# Patient Record
Sex: Female | Born: 1960 | Race: White | Hispanic: No | Marital: Married | Smoking: Former smoker
Health system: Southern US, Community
[De-identification: ages and names within clinical notes are randomized; demographics above are authoritative.]

## PROBLEM LIST (undated history)

## (undated) DIAGNOSIS — K589 Irritable bowel syndrome without diarrhea: Secondary | ICD-10-CM

## (undated) DIAGNOSIS — N920 Excessive and frequent menstruation with regular cycle: Secondary | ICD-10-CM

## (undated) DIAGNOSIS — G43909 Migraine, unspecified, not intractable, without status migrainosus: Secondary | ICD-10-CM

## (undated) DIAGNOSIS — K219 Gastro-esophageal reflux disease without esophagitis: Secondary | ICD-10-CM

## (undated) DIAGNOSIS — N946 Dysmenorrhea, unspecified: Secondary | ICD-10-CM

## (undated) DIAGNOSIS — N950 Postmenopausal bleeding: Secondary | ICD-10-CM

---

## 2006-02-23 ENCOUNTER — Ambulatory Visit: Payer: Self-pay | Admitting: Nurse Practitioner

## 2006-10-13 ENCOUNTER — Ambulatory Visit: Payer: Self-pay

## 2006-10-15 ENCOUNTER — Ambulatory Visit: Payer: Self-pay

## 2006-11-16 ENCOUNTER — Ambulatory Visit: Payer: Self-pay | Admitting: General Surgery

## 2006-12-27 ENCOUNTER — Emergency Department: Payer: Self-pay | Admitting: Emergency Medicine

## 2006-12-27 ENCOUNTER — Other Ambulatory Visit: Payer: Self-pay

## 2007-07-08 ENCOUNTER — Ambulatory Visit: Payer: Self-pay | Admitting: Family Medicine

## 2007-07-13 ENCOUNTER — Ambulatory Visit: Payer: Self-pay | Admitting: Family Medicine

## 2007-10-12 ENCOUNTER — Ambulatory Visit: Payer: Self-pay

## 2008-03-23 ENCOUNTER — Emergency Department: Payer: Self-pay | Admitting: Emergency Medicine

## 2008-04-03 ENCOUNTER — Ambulatory Visit: Payer: Self-pay | Admitting: Family Medicine

## 2008-06-12 ENCOUNTER — Ambulatory Visit: Payer: Self-pay

## 2008-10-16 ENCOUNTER — Ambulatory Visit: Payer: Self-pay

## 2009-01-10 ENCOUNTER — Ambulatory Visit: Payer: Self-pay | Admitting: Internal Medicine

## 2009-02-06 ENCOUNTER — Ambulatory Visit: Payer: Self-pay | Admitting: Family Medicine

## 2009-02-07 ENCOUNTER — Ambulatory Visit: Payer: Self-pay | Admitting: Family Medicine

## 2009-09-03 ENCOUNTER — Ambulatory Visit: Payer: Self-pay | Admitting: Internal Medicine

## 2010-01-10 ENCOUNTER — Ambulatory Visit: Payer: Self-pay | Admitting: Family Medicine

## 2010-02-17 ENCOUNTER — Emergency Department: Payer: Self-pay | Admitting: Emergency Medicine

## 2010-02-19 ENCOUNTER — Ambulatory Visit: Payer: Self-pay | Admitting: Family Medicine

## 2010-02-20 ENCOUNTER — Ambulatory Visit: Payer: Self-pay | Admitting: Family Medicine

## 2010-04-14 ENCOUNTER — Ambulatory Visit: Payer: Self-pay | Admitting: Family Medicine

## 2011-01-28 ENCOUNTER — Ambulatory Visit: Payer: Self-pay

## 2012-04-13 ENCOUNTER — Ambulatory Visit: Payer: Self-pay

## 2012-07-05 ENCOUNTER — Ambulatory Visit: Payer: Self-pay | Admitting: Internal Medicine

## 2012-11-30 HISTORY — PX: BREAST EXCISIONAL BIOPSY: SUR124

## 2013-05-13 ENCOUNTER — Ambulatory Visit: Payer: Self-pay | Admitting: Family Medicine

## 2013-05-14 ENCOUNTER — Ambulatory Visit: Payer: Self-pay | Admitting: Internal Medicine

## 2013-05-24 ENCOUNTER — Ambulatory Visit: Payer: Self-pay

## 2013-05-31 ENCOUNTER — Ambulatory Visit: Payer: Self-pay

## 2013-06-09 ENCOUNTER — Ambulatory Visit: Payer: Self-pay | Admitting: Family Medicine

## 2013-06-09 LAB — COMPREHENSIVE METABOLIC PANEL
Albumin: 3.5 g/dL (ref 3.4–5.0)
Alkaline Phosphatase: 76 U/L (ref 50–136)
BUN: 8 mg/dL (ref 7–18)
Bilirubin,Total: 0.9 mg/dL (ref 0.2–1.0)
Chloride: 104 mmol/L (ref 98–107)
Creatinine: 0.82 mg/dL (ref 0.60–1.30)
EGFR (African American): >60
EGFR (Non-African Amer.): >60
Glucose: 92 mg/dL (ref 65–99)
Osmolality: 281 (ref 275–301)
Potassium: 3 mmol/L — ABNORMAL LOW (ref 3.5–5.1)
SGOT(AST): 14 U/L — ABNORMAL LOW (ref 15–37)

## 2013-06-09 LAB — CBC WITH DIFFERENTIAL/PLATELET
Basophil #: 0 10*3/uL (ref 0.0–0.1)
Basophil %: 0.4 %
Eosinophil %: 0.8 %
HCT: 34.3 % — ABNORMAL LOW (ref 35.0–47.0)
HGB: 11.2 g/dL — ABNORMAL LOW (ref 12.0–16.0)
Lymphocyte #: 1.3 10*3/uL (ref 1.0–3.6)
Lymphocyte %: 18 %
MCH: 30 pg (ref 26.0–34.0)
MCHC: 32.7 g/dL (ref 32.0–36.0)
MCV: 92 fL (ref 80–100)
Monocyte #: 0.5 x10 3/mm (ref 0.2–0.9)
Monocyte %: 7 %
Neutrophil #: 5.4 10*3/uL (ref 1.4–6.5)
Neutrophil %: 73.8 %
Platelet: 268 10*3/uL (ref 150–440)

## 2013-06-09 LAB — LIPASE, BLOOD: Lipase: 128 U/L (ref 73–393)

## 2013-06-09 LAB — URINALYSIS, COMPLETE
Bacteria: NEGATIVE
Bilirubin,UR: NEGATIVE
Ketone: NEGATIVE
Nitrite: NEGATIVE
Ph: 7 (ref 4.5–8.0)
Specific Gravity: 1.005 (ref 1.003–1.030)

## 2013-06-21 ENCOUNTER — Ambulatory Visit: Payer: Self-pay | Admitting: Gastroenterology

## 2013-06-28 DIAGNOSIS — R9389 Abnormal findings on diagnostic imaging of other specified body structures: Secondary | ICD-10-CM | POA: Insufficient documentation

## 2014-01-31 ENCOUNTER — Ambulatory Visit: Payer: Self-pay | Admitting: Family Medicine

## 2014-01-31 LAB — CBC WITH DIFFERENTIAL/PLATELET
Basophil #: 0 10*3/uL (ref 0.0–0.1)
Basophil %: 0.8 %
Eosinophil #: 0.1 10*3/uL (ref 0.0–0.7)
Eosinophil %: 1.4 %
HCT: 32 % — ABNORMAL LOW (ref 35.0–47.0)
HGB: 10.2 g/dL — AB (ref 12.0–16.0)
LYMPHS ABS: 1.8 10*3/uL (ref 1.0–3.6)
Lymphocyte %: 28.7 %
MCH: 29.7 pg (ref 26.0–34.0)
MCHC: 31.8 g/dL — ABNORMAL LOW (ref 32.0–36.0)
MCV: 93 fL (ref 80–100)
Monocyte #: 0.5 x10 3/mm (ref 0.2–0.9)
Monocyte %: 7.6 %
Neutrophil #: 4 10*3/uL (ref 1.4–6.5)
Neutrophil %: 61.5 %
Platelet: 324 10*3/uL (ref 150–440)
RBC: 3.43 10*6/uL — AB (ref 3.80–5.20)
RDW: 18 % — AB (ref 11.5–14.5)
WBC: 6.4 10*3/uL (ref 3.6–11.0)

## 2014-03-07 ENCOUNTER — Ambulatory Visit: Payer: Self-pay | Admitting: Family Medicine

## 2014-06-19 ENCOUNTER — Ambulatory Visit: Payer: Self-pay | Admitting: Family Medicine

## 2014-06-19 LAB — CBC WITH DIFFERENTIAL/PLATELET
BASOS ABS: 0 10*3/uL (ref 0.0–0.1)
Basophil %: 0.7 %
Eosinophil #: 0.1 10*3/uL (ref 0.0–0.7)
Eosinophil %: 1.6 %
HCT: 30 % — AB (ref 35.0–47.0)
HGB: 9.7 g/dL — AB (ref 12.0–16.0)
LYMPHS PCT: 28.2 %
Lymphocyte #: 1.7 10*3/uL (ref 1.0–3.6)
MCH: 29.4 pg (ref 26.0–34.0)
MCHC: 32.3 g/dL (ref 32.0–36.0)
MCV: 91 fL (ref 80–100)
MONOS PCT: 8.3 %
Monocyte #: 0.5 x10 3/mm (ref 0.2–0.9)
NEUTROS ABS: 3.8 10*3/uL (ref 1.4–6.5)
Neutrophil %: 61.2 %
PLATELETS: 338 10*3/uL (ref 150–440)
RBC: 3.29 10*6/uL — AB (ref 3.80–5.20)
RDW: 18.6 % — ABNORMAL HIGH (ref 11.5–14.5)
WBC: 6.2 10*3/uL (ref 3.6–11.0)

## 2014-06-19 LAB — URINALYSIS, COMPLETE
Bilirubin,UR: NEGATIVE
Blood: NEGATIVE
GLUCOSE, UR: NEGATIVE mg/dL (ref 0–75)
Ketone: NEGATIVE
Leukocyte Esterase: NEGATIVE
Nitrite: NEGATIVE
PH: 6 (ref 4.5–8.0)
Protein: NEGATIVE
SPECIFIC GRAVITY: 1.025 (ref 1.003–1.030)

## 2014-06-19 LAB — PREGNANCY, URINE: PREGNANCY TEST, URINE: NEGATIVE m[IU]/mL

## 2014-06-19 LAB — IRON: IRON: 27 ug/dL — AB (ref 50–170)

## 2014-06-19 LAB — FERRITIN: Ferritin (ARMC): 8 ng/mL (ref 8–388)

## 2014-10-30 DIAGNOSIS — N840 Polyp of corpus uteri: Secondary | ICD-10-CM | POA: Insufficient documentation

## 2014-10-30 DIAGNOSIS — N9489 Other specified conditions associated with female genital organs and menstrual cycle: Secondary | ICD-10-CM | POA: Insufficient documentation

## 2014-10-30 DIAGNOSIS — N939 Abnormal uterine and vaginal bleeding, unspecified: Secondary | ICD-10-CM | POA: Insufficient documentation

## 2014-11-19 ENCOUNTER — Ambulatory Visit: Payer: Self-pay | Admitting: Family Medicine

## 2014-11-19 LAB — URINALYSIS, COMPLETE
Bilirubin,UR: NEGATIVE
Glucose,UR: NEGATIVE
KETONE: NEGATIVE
Leukocyte Esterase: NEGATIVE
NITRITE: NEGATIVE
PROTEIN: NEGATIVE
Ph: 7 (ref 5.0–8.0)
Specific Gravity: 1.02 (ref 1.000–1.030)

## 2014-11-19 LAB — WET PREP, GENITAL

## 2014-11-20 LAB — GC/CHLAMYDIA PROBE AMP

## 2014-11-22 ENCOUNTER — Emergency Department: Payer: Self-pay | Admitting: Emergency Medicine

## 2014-11-22 LAB — CBC WITH DIFFERENTIAL/PLATELET
BASOS PCT: 0.3 %
Basophil #: 0 10*3/uL (ref 0.0–0.1)
Eosinophil #: 0 10*3/uL (ref 0.0–0.7)
Eosinophil %: 0.2 %
HCT: 37.5 % (ref 35.0–47.0)
HGB: 12.3 g/dL (ref 12.0–16.0)
LYMPHS ABS: 1.1 10*3/uL (ref 1.0–3.6)
Lymphocyte %: 8.1 %
MCH: 33.2 pg (ref 26.0–34.0)
MCHC: 32.7 g/dL (ref 32.0–36.0)
MCV: 101 fL — ABNORMAL HIGH (ref 80–100)
Monocyte #: 0.9 x10 3/mm (ref 0.2–0.9)
Monocyte %: 6.8 %
Neutrophil #: 11 10*3/uL — ABNORMAL HIGH (ref 1.4–6.5)
Neutrophil %: 84.6 %
Platelet: 252 10*3/uL (ref 150–440)
RBC: 3.69 10*6/uL — ABNORMAL LOW (ref 3.80–5.20)
RDW: 13.2 % (ref 11.5–14.5)
WBC: 13.1 10*3/uL — AB (ref 3.6–11.0)

## 2014-11-22 LAB — COMPREHENSIVE METABOLIC PANEL
ALT: 68 U/L — AB
Albumin: 2.8 g/dL — ABNORMAL LOW (ref 3.4–5.0)
Alkaline Phosphatase: 154 U/L — ABNORMAL HIGH
Anion Gap: 10 (ref 7–16)
BILIRUBIN TOTAL: 2.2 mg/dL — AB (ref 0.2–1.0)
BUN: 15 mg/dL (ref 7–18)
CO2: 25 mmol/L (ref 21–32)
Calcium, Total: 8.7 mg/dL (ref 8.5–10.1)
Chloride: 102 mmol/L (ref 98–107)
Creatinine: 0.9 mg/dL (ref 0.60–1.30)
EGFR (African American): >60
EGFR (Non-African Amer.): >60
Glucose: 102 mg/dL — ABNORMAL HIGH (ref 65–99)
Osmolality: 275 (ref 275–301)
Potassium: 3.6 mmol/L (ref 3.5–5.1)
SGOT(AST): 52 U/L — ABNORMAL HIGH (ref 15–37)
Sodium: 137 mmol/L (ref 136–145)
Total Protein: 7.5 g/dL (ref 6.4–8.2)

## 2014-11-22 LAB — URINALYSIS, COMPLETE
Bacteria: NONE SEEN
Glucose,UR: NEGATIVE mg/dL (ref 0–75)
Ketone: NEGATIVE
Nitrite: NEGATIVE
PH: 5 (ref 4.5–8.0)
Protein: 100
RBC,UR: 279 /HPF (ref 0–5)
Specific Gravity: 1.057 (ref 1.003–1.030)
Squamous Epithelial: 20
WBC UR: 47 /HPF (ref 0–5)

## 2014-11-22 LAB — LIPASE, BLOOD: Lipase: 70 U/L — ABNORMAL LOW (ref 73–393)

## 2014-11-26 ENCOUNTER — Ambulatory Visit: Payer: Self-pay | Admitting: Obstetrics and Gynecology

## 2014-11-26 ENCOUNTER — Inpatient Hospital Stay: Payer: Self-pay | Admitting: Obstetrics and Gynecology

## 2014-11-26 DIAGNOSIS — R109 Unspecified abdominal pain: Secondary | ICD-10-CM | POA: Insufficient documentation

## 2014-11-26 LAB — WOUND CULTURE

## 2014-11-27 LAB — URINALYSIS, COMPLETE
BILIRUBIN, UR: NEGATIVE
Glucose,UR: NEGATIVE mg/dL (ref 0–75)
Ketone: NEGATIVE
Nitrite: NEGATIVE
PH: 6 (ref 4.5–8.0)
Protein: 30
SPECIFIC GRAVITY: 1.034 (ref 1.003–1.030)

## 2014-11-27 LAB — CBC WITH DIFFERENTIAL/PLATELET
BASOS ABS: 0 10*3/uL (ref 0.0–0.1)
Basophil %: 0.3 %
Eosinophil #: 0.1 10*3/uL (ref 0.0–0.7)
Eosinophil %: 1.5 %
HCT: 32.2 % — ABNORMAL LOW (ref 35.0–47.0)
HGB: 10.8 g/dL — ABNORMAL LOW (ref 12.0–16.0)
Lymphocyte #: 1 10*3/uL (ref 1.0–3.6)
Lymphocyte %: 12.6 %
MCH: 34.4 pg — ABNORMAL HIGH (ref 26.0–34.0)
MCHC: 33.6 g/dL (ref 32.0–36.0)
MCV: 102 fL — ABNORMAL HIGH (ref 80–100)
MONO ABS: 0.9 x10 3/mm (ref 0.2–0.9)
MONOS PCT: 10.6 %
Neutrophil #: 6.2 10*3/uL (ref 1.4–6.5)
Neutrophil %: 75 %
Platelet: 316 10*3/uL (ref 150–440)
RBC: 3.14 10*6/uL — ABNORMAL LOW (ref 3.80–5.20)
RDW: 13.6 % (ref 11.5–14.5)
WBC: 8.3 10*3/uL (ref 3.6–11.0)

## 2014-11-27 LAB — COMPREHENSIVE METABOLIC PANEL
ALK PHOS: 127 U/L — AB
ANION GAP: 8 (ref 7–16)
AST: 19 U/L (ref 15–37)
Albumin: 2.2 g/dL — ABNORMAL LOW (ref 3.4–5.0)
BILIRUBIN TOTAL: 0.8 mg/dL (ref 0.2–1.0)
BUN: 10 mg/dL (ref 7–18)
CO2: 30 mmol/L (ref 21–32)
Calcium, Total: 8.1 mg/dL — ABNORMAL LOW (ref 8.5–10.1)
Chloride: 100 mmol/L (ref 98–107)
Creatinine: 1.11 mg/dL (ref 0.60–1.30)
EGFR (African American): >60
EGFR (Non-African Amer.): 55 — ABNORMAL LOW
GLUCOSE: 108 mg/dL — AB (ref 65–99)
Osmolality: 275 (ref 275–301)
POTASSIUM: 3.1 mmol/L — AB (ref 3.5–5.1)
SGPT (ALT): 24 U/L
Sodium: 138 mmol/L (ref 136–145)
TOTAL PROTEIN: 6.5 g/dL (ref 6.4–8.2)

## 2014-11-29 LAB — CBC WITH DIFFERENTIAL/PLATELET
Basophil #: 0 10*3/uL (ref 0.0–0.1)
Basophil %: 0.3 %
Eosinophil #: 0.1 10*3/uL (ref 0.0–0.7)
Eosinophil %: 0.6 %
HCT: 30 % — ABNORMAL LOW (ref 35.0–47.0)
HGB: 10 g/dL — AB (ref 12.0–16.0)
Lymphocyte #: 1.3 10*3/uL (ref 1.0–3.6)
Lymphocyte %: 11.1 %
MCH: 33.3 pg (ref 26.0–34.0)
MCHC: 33.4 g/dL (ref 32.0–36.0)
MCV: 100 fL (ref 80–100)
MONO ABS: 1.1 x10 3/mm — AB (ref 0.2–0.9)
MONOS PCT: 9.4 %
Neutrophil #: 9.5 10*3/uL — ABNORMAL HIGH (ref 1.4–6.5)
Neutrophil %: 78.6 %
PLATELETS: 418 10*3/uL (ref 150–440)
RBC: 3 10*6/uL — ABNORMAL LOW (ref 3.80–5.20)
RDW: 13.6 % (ref 11.5–14.5)
WBC: 12.1 10*3/uL — AB (ref 3.6–11.0)

## 2014-11-29 LAB — COMPREHENSIVE METABOLIC PANEL
ANION GAP: 4 — AB (ref 7–16)
Albumin: 1.9 g/dL — ABNORMAL LOW (ref 3.4–5.0)
Alkaline Phosphatase: 122 U/L — ABNORMAL HIGH
BILIRUBIN TOTAL: 0.6 mg/dL (ref 0.2–1.0)
BUN: 5 mg/dL — AB (ref 7–18)
CALCIUM: 8.3 mg/dL — AB (ref 8.5–10.1)
CO2: 28 mmol/L (ref 21–32)
Chloride: 107 mmol/L (ref 98–107)
Creatinine: 0.91 mg/dL (ref 0.60–1.30)
EGFR (African American): >60
EGFR (Non-African Amer.): >60
GLUCOSE: 115 mg/dL — AB (ref 65–99)
OSMOLALITY: 276 (ref 275–301)
Potassium: 4.3 mmol/L (ref 3.5–5.1)
SGOT(AST): 17 U/L (ref 15–37)
SGPT (ALT): 14 U/L
SODIUM: 139 mmol/L (ref 136–145)
TOTAL PROTEIN: 6.3 g/dL — AB (ref 6.4–8.2)

## 2014-12-01 LAB — URINE CULTURE

## 2015-04-09 NOTE — H&P (Signed)
L&D Evaluation:  History:  HPI 54yo G2P2 presenting with abdominal pain xseveral days.   Pt saw Dr Feliberto GottronSchermerhorn on 10/30/14 for irreg menses. US showed an endometrial polyp, hydrosalpinx on the left and a complex left ovarian mass. Pt had an EMB which was neg for endometrial hyperplasia and carcinoma. At that time she had bilateral cramping, R>L. Exam revealed an 8 week uterine size and a firm mildly tender 3-4 cm mass on Lt post fundal area. [facility] and was tx with Doxycycline for BV for 7 days, but she only took for 5 days 2/2 nause/vomiting. Neg Trich, neg hyphae, + clue on 11/[facility].  [facility]14 with temp of 103.1. Culture at that time showed BV and Klebseilla; neg for GC/CT. Neg pregnancy test. Tx with Advil.   Today, she presented to our outpatient clinic. She reports pain of a 2-3 on 1-10 scale in the lower pelvic area and feels poorly. Has had some vaginal spotting. An outpatient CT scan today revealed Tubular complex cystic lesion in the left adnexa measuring 5.1 x 3.4 cm (series 2/image 39), likely reflecting a pyosalpinx/tubo-ovarian abscess, with rim-enhancing lesion. Left mild hydroureternephrosis.  WBC decreased mildly from ER visit.   Presents with abdominal pain, TOA   Allergies PCN, Codeine   Social History none   Family History Non-Contributory   ROS:  ROS See HPI   Exam:  Vital Signs stable   Urine Protein not completed   General no apparent distress, ill-appearing   Mental Status clear   Chest clear   Heart normal sinus rhythm, no murmur/gallop/rubs   Abdomen Non tender, some left sided tenderness to deep palpation   Back no CVAT   Edema no edema   Skin dry   Lymph no lymphadenopathy   Impression:  Impression TOA, mild hydronephrosis   Plan:  Comments Inpatient management for TOA/PID. Plan for 48-72 hr inpatient monitoring. - IV antibiotics: cefoxitin 2g iv q6hrs,  doxycycline 100mg  iv q12h. Antiemetics PRN. Cultures from ER visit show Klebseilla resistant to amox. - Currently TOA is <9cm, which is the cutoff for considering surgical management instead of initial medical management. She is stable, currently, but OR would be next step for signs of rupture or failure of medical management.  - Mild hydroureter and hydronephrosis: likely from TOA obstruction of urinary system. Will monitor renal labs and consider consult to urology if no improvement, decreased urine output, or elevating BUN/Cr-.  - Labs daily. While in ER, elevated liver enzymes. Monitor renal function and WBC. - Is/Os, vitals q4 to monitor for s/s of sepsis - NPO currently while initial eval - If improvement in 48-72 hrs, consider d/c home with antibiotics for 2 weeks.   Electronic Signatures: Cline CoolsBeasley, Harriette Tovey E (MD)  (Signed 29-Dec-15 09:40)  Authored: L&D Evaluation   Last Updated: 29-Dec-15 09:40 by Cline CoolsBeasley, Danniela Mcbrearty E (MD)

## 2015-04-10 ENCOUNTER — Other Ambulatory Visit: Payer: Self-pay | Admitting: Obstetrics and Gynecology

## 2015-04-10 DIAGNOSIS — Z1231 Encounter for screening mammogram for malignant neoplasm of breast: Secondary | ICD-10-CM

## 2015-04-24 ENCOUNTER — Ambulatory Visit
Admission: RE | Admit: 2015-04-24 | Discharge: 2015-04-24 | Disposition: A | Discharge status: Home / Self Care | Payer: 59 | Source: Ambulatory Visit | Attending: Obstetrics and Gynecology | Admitting: Obstetrics and Gynecology

## 2015-04-24 DIAGNOSIS — Z1231 Encounter for screening mammogram for malignant neoplasm of breast: Secondary | ICD-10-CM | POA: Diagnosis present

## 2016-01-29 DIAGNOSIS — K219 Gastro-esophageal reflux disease without esophagitis: Secondary | ICD-10-CM | POA: Insufficient documentation

## 2016-01-29 DIAGNOSIS — M7989 Other specified soft tissue disorders: Secondary | ICD-10-CM | POA: Insufficient documentation

## 2016-01-29 DIAGNOSIS — I83893 Varicose veins of bilateral lower extremities with other complications: Secondary | ICD-10-CM | POA: Insufficient documentation

## 2016-01-29 DIAGNOSIS — R635 Abnormal weight gain: Secondary | ICD-10-CM | POA: Insufficient documentation

## 2016-01-29 DIAGNOSIS — Z6832 Body mass index (BMI) 32.0-32.9, adult: Secondary | ICD-10-CM | POA: Insufficient documentation

## 2016-02-06 ENCOUNTER — Other Ambulatory Visit: Payer: Self-pay | Admitting: Vascular Surgery

## 2016-02-06 DIAGNOSIS — R2241 Localized swelling, mass and lump, right lower limb: Secondary | ICD-10-CM

## 2016-02-14 ENCOUNTER — Other Ambulatory Visit: Payer: Self-pay | Admitting: Vascular Surgery

## 2016-02-14 ENCOUNTER — Ambulatory Visit
Admission: RE | Admit: 2016-02-14 | Discharge: 2016-02-14 | Disposition: A | Discharge status: Home / Self Care | Payer: BLUE CROSS/BLUE SHIELD | Source: Ambulatory Visit | Attending: Vascular Surgery | Admitting: Vascular Surgery

## 2016-02-14 DIAGNOSIS — R2241 Localized swelling, mass and lump, right lower limb: Secondary | ICD-10-CM

## 2016-02-14 MED ORDER — IOHEXOL 300 MG/ML  SOLN
100.0000 mL | Freq: Once | INTRAMUSCULAR | Status: AC | PRN
  Administered 2016-02-14: 100 mL via INTRAVENOUS

## 2016-02-29 IMAGING — US US PELV - US TRANSVAGINAL
1 series · 13 of 25 positions shown · non-contrast
Comparison: 02/19/2010

CLINICAL DATA: Severe pelvic pain, recent treatment for PID, known
uterine polyp

EXAM:
TRANSABDOMINAL AND TRANSVAGINAL ULTRASOUND OF PELVIS
TECHNIQUE: Both transabdominal and transvaginal ultrasound examinations of the
pelvis were performed. Transabdominal technique was performed for
global imaging of the pelvis including uterus, ovaries, adnexal
regions, and pelvic cul-de-sac. It was necessary to proceed with
endovaginal exam following the transabdominal exam to visualize the
endometrium, ovaries and adnexae. Transabdominal images limited by
inadequate bladder distention and poor acoustic window.

[Series 1: us pelv - us transvaginal · 0.27mm/px · 13 of 85 slices shown]
[im 1/85]
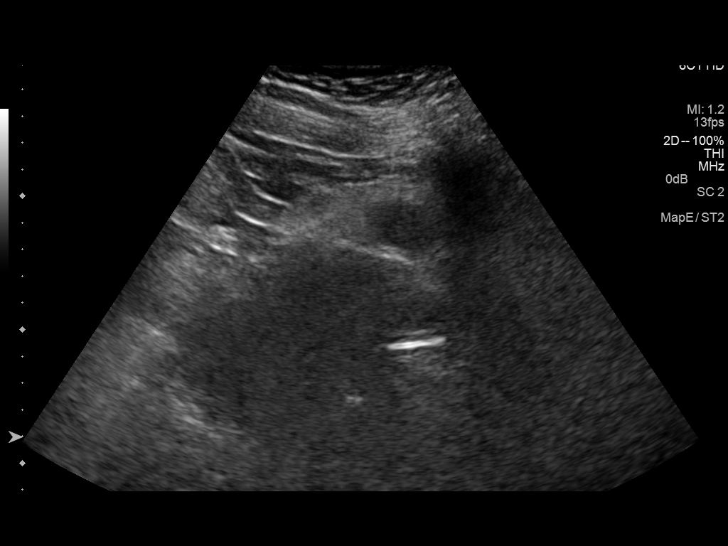
[im 8/85]
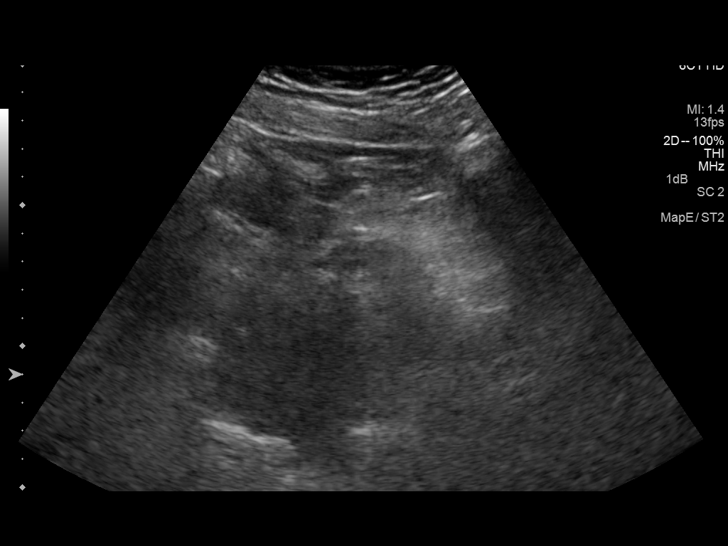
[im 15/85]
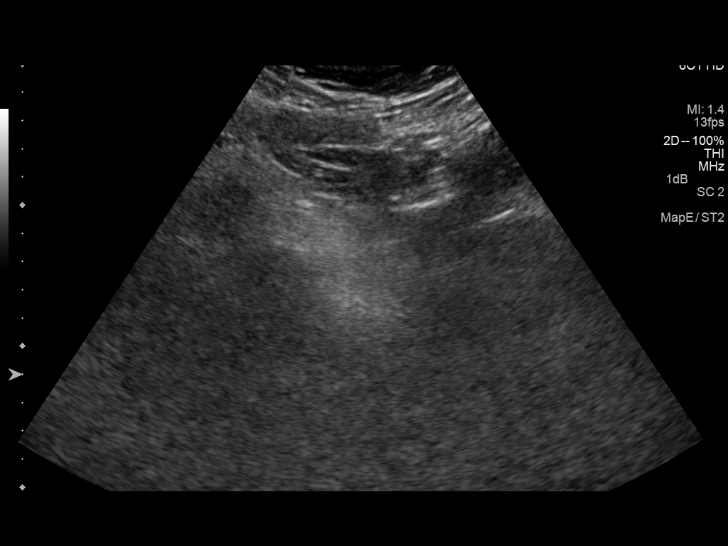
[im 22/85]
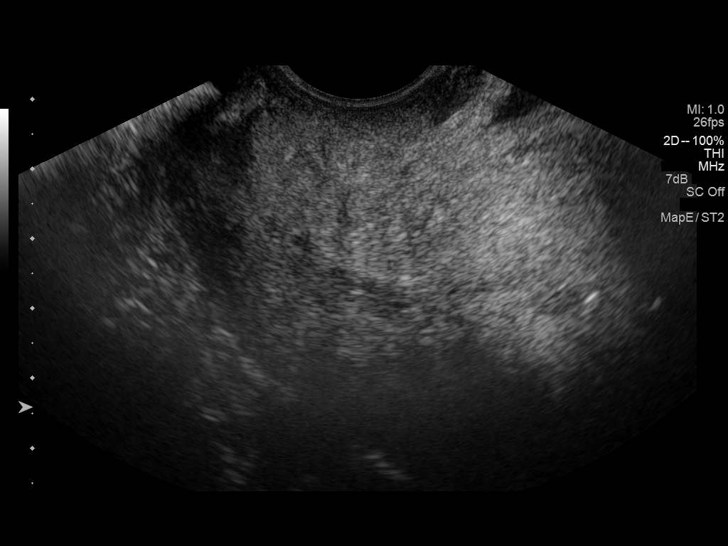
[im 29/85]
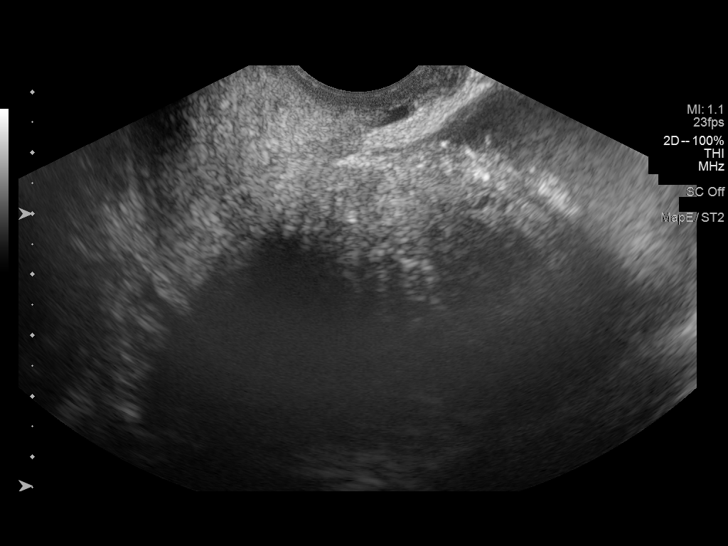
[im 36/85]
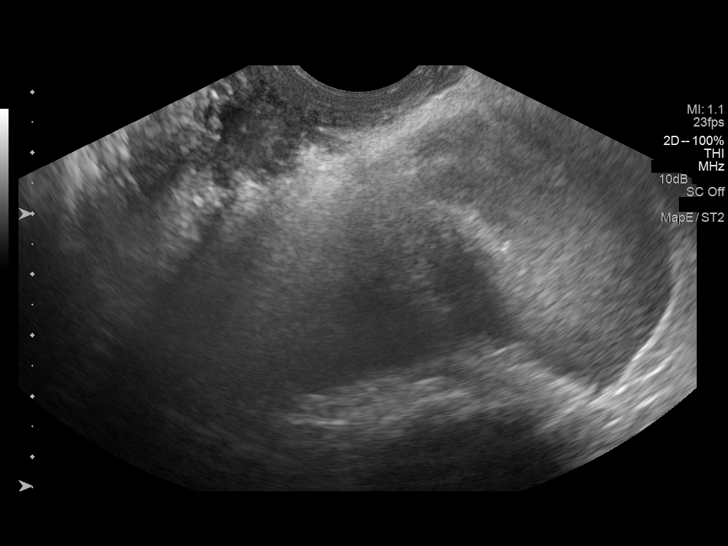
[im 43/85]
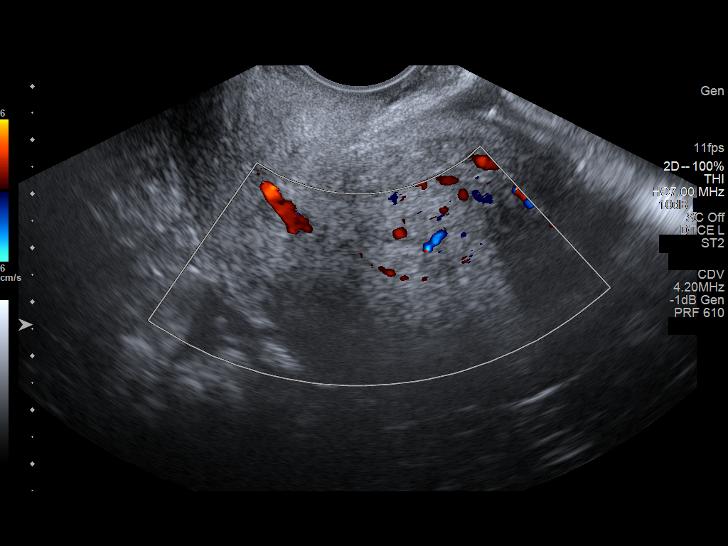
[im 50/85]
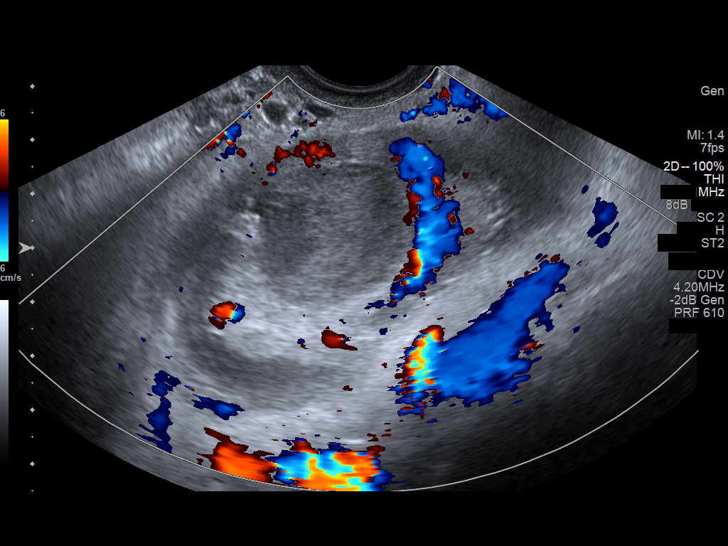
[im 57/85]
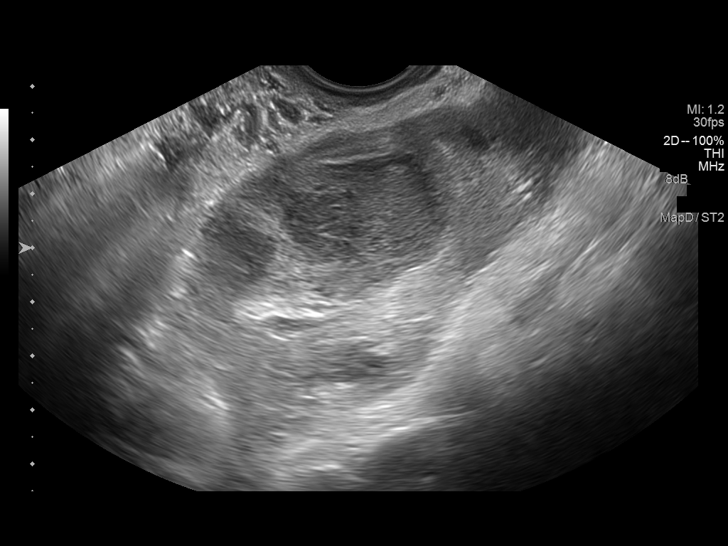
[im 64/85]
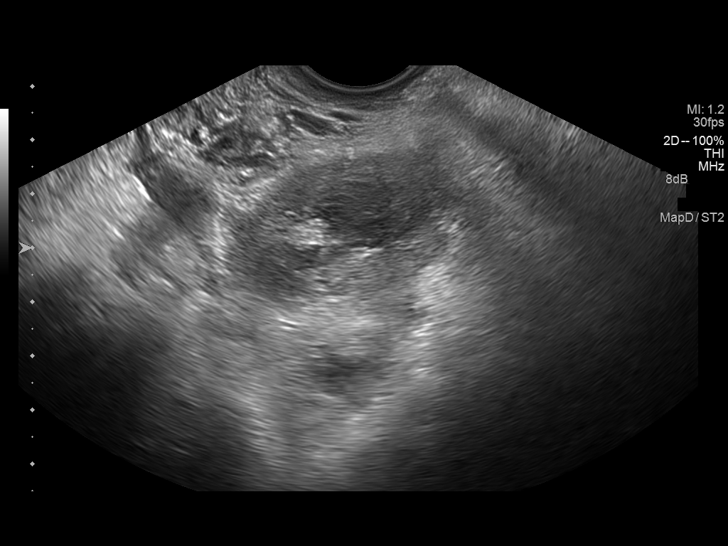
[im 71/85]
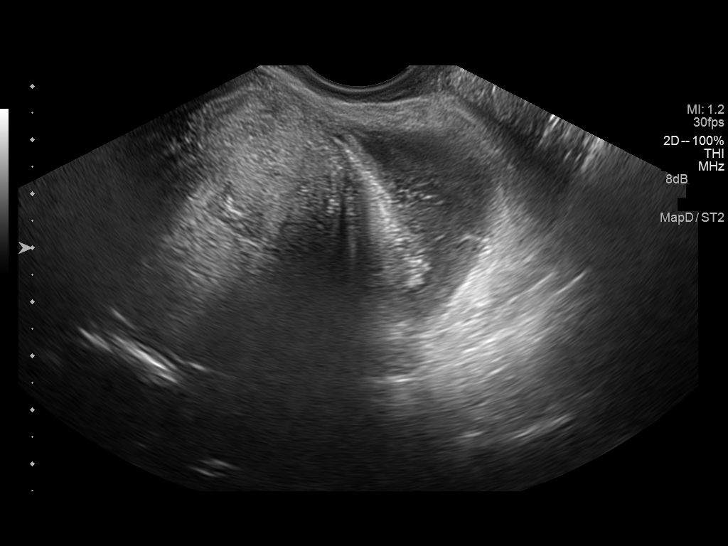
[im 78/85]
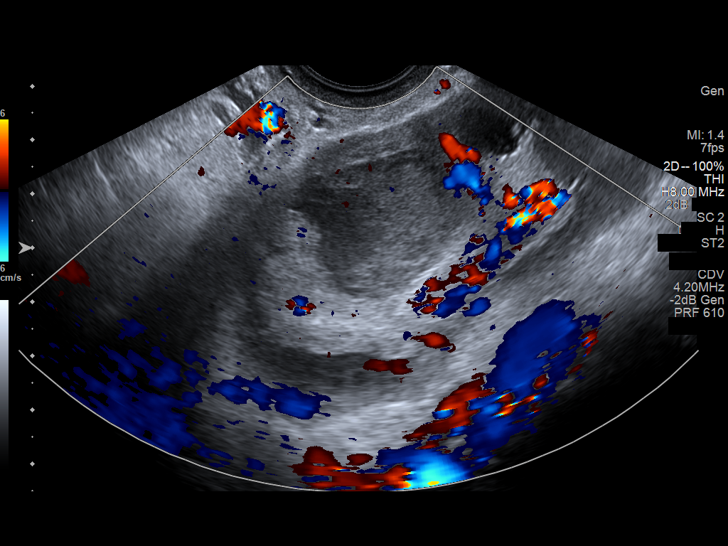
[im 85/85]
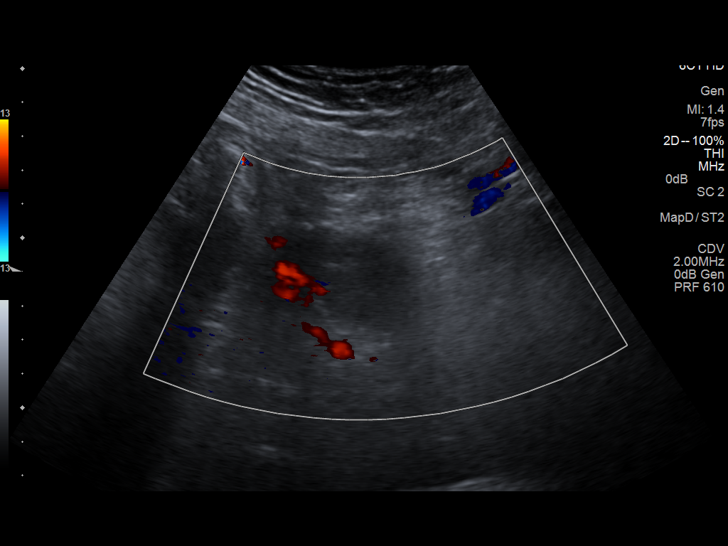

[13 of 25 positions shown; findings below may reference images not displayed]

FINDINGS: Uterus

Measurements: 5.4 x 5.5 x 5.5 cm. Grossly normal morphology without
mass.

Endometrium

Thickness: Suboptimally visualized, question 12 mm thick. No
endometrial fluid. Blood flow seen within endometrium on color
Doppler imaging. Known endometrial polyp is not adequately
delineated.

Right ovary

Not visualized on either transabdominal or endovaginal imaging
question related to obscuration by bowel.

Left ovary

Normal appearing LEFT ovary not identified. Complex LEFT adnexal
mass 7.7 x 4.0 x 3.9 cm in size, containing areas of somewhat
tubular appearing hypo echogenicity, question tubo-ovarian abscess.
Portions though this region do not contain internal blood flow on
color Doppler imaging. LEFT adnexal/ovarian neoplasm not excluded.

Other findings

Moderate free pelvic fluid especially in LEFT adnexa adjacent
uterus, some which contains debris. No other pelvic masses.
IMPRESSION: Suboptimally visualized endometrial complex.

Nonvisualization of RIGHT ovary.

Complex mass LEFT adnexa 7.7 x 4.0 x 3.9 cm, question tubo-ovarian
abscess though cannot exclude tumor; this would be best evaluated by
followup MR imaging with and without contrast to characterize.

## 2016-03-17 ENCOUNTER — Other Ambulatory Visit: Payer: Self-pay | Admitting: Obstetrics and Gynecology

## 2016-03-28 ENCOUNTER — Encounter: Payer: Self-pay | Admitting: Gynecology

## 2016-03-28 ENCOUNTER — Ambulatory Visit
Admission: EM | Admit: 2016-03-28 | Discharge: 2016-03-28 | Disposition: A | Discharge status: Home / Self Care | Payer: BLUE CROSS/BLUE SHIELD | Attending: Family Medicine | Admitting: Family Medicine

## 2016-03-28 DIAGNOSIS — H6502 Acute serous otitis media, left ear: Secondary | ICD-10-CM

## 2016-03-28 DIAGNOSIS — J020 Streptococcal pharyngitis: Secondary | ICD-10-CM

## 2016-03-28 HISTORY — DX: Gastro-esophageal reflux disease without esophagitis: K21.9

## 2016-03-28 HISTORY — DX: Irritable bowel syndrome, unspecified: K58.9

## 2016-03-28 HISTORY — DX: Postmenopausal bleeding: N95.0

## 2016-03-28 HISTORY — DX: Excessive and frequent menstruation with regular cycle: N92.0

## 2016-03-28 HISTORY — DX: Dysmenorrhea, unspecified: N94.6

## 2016-03-28 HISTORY — DX: Migraine, unspecified, not intractable, without status migrainosus: G43.909

## 2016-03-28 LAB — RAPID STREP SCREEN (MED CTR MEBANE ONLY): Streptococcus, Group A Screen (Direct): POSITIVE — AB

## 2016-03-28 MED ORDER — AZITHROMYCIN 250 MG PO TABS
ORAL_TABLET | ORAL | Status: DC
Start: 2016-03-28 — End: 2018-03-16

## 2016-03-28 MED ORDER — LIDOCAINE VISCOUS 2 % MT SOLN: 20.0000 mL | Freq: Four times a day (QID) | OROMUCOSAL | Status: DC | PRN

## 2016-03-28 NOTE — ED Provider Notes (Signed)
CSN: [id number]     Arrival date & time 03/28/16  1107 History   First MD Initiated Contact with Patient 03/28/16 1400     Chief Complaint  Patient presents with  . Sore Throat   (Consider location/radiation/quality/duration/timing/severity/associated sxs/prior Treatment) Patient is a 55 y.o. female presenting with pharyngitis. The history is provided by the patient.  Sore Throat This is a new problem. The current episode started yesterday. The problem occurs constantly. The problem has been gradually worsening. Pertinent negatives include no abdominal pain, no headaches and no shortness of breath. Associated symptoms comments: Left ear pain.    Past Medical History  Diagnosis Date  . IBS (irritable bowel syndrome)   . Migraine   . Menorrhagia   . Dysmenorrhea   . PMB (postmenopausal bleeding)   . GERD (gastroesophageal reflux disease)    Past Surgical History  Procedure Laterality Date  . Breast surgery     Family History  Problem Relation Age of Onset  . Ovarian cancer Maternal Aunt [id number]   Social History  Substance Use Topics  . Smoking status: Former Games developermoker  . Smokeless tobacco: None  . Alcohol Use: No   OB History    No data available     Review of Systems  Respiratory: Negative for shortness of breath.   Gastrointestinal: Negative for abdominal pain.  Neurological: Negative for headaches.    Allergies  Codeine and Amoxicillin  Home Medications   Prior to Admission medications   Medication Sig Start Date End Date Taking? Authorizing Provider  cyanocobalamin 500 MCG tablet Take 500 mcg by mouth daily.   Yes Historical Provider, MD  ferrous sulfate 325 (65 FE) MG EC tablet Take 325 mg by mouth 3 (three) times daily with meals.   Yes Historical Provider, MD  ondansetron (ZOFRAN) 8 MG tablet Take by mouth every 8 (eight) hours as needed for nausea or vomiting.   Yes Historical Provider, MD  pantoprazole (PROTONIX) 40 MG tablet Take 40 mg by mouth daily.   Yes  Historical Provider, MD  azithromycin (ZITHROMAX Z-PAK) 250 MG tablet 2 tabs po once day 1, then 1 tab po qd for next 4 days 03/28/16   Payton Mccallumrlando Robey Massmann, MD  lidocaine (XYLOCAINE) 2 % solution Use as directed 20 mLs in the mouth or throat every 6 (six) hours as needed (throat pain). 03/28/16   Payton Mccallumrlando Jakie Debow, MD   Meds Ordered and Administered this Visit  Medications - No data to display  BP 119/73 mmHg  Pulse 71  Temp(Src) 98 F (36.7 C) (Oral)  Resp 18  Ht 5\' 11"  (1.803 m)  Wt 230 lb (104.327 kg)  BMI 32.09 kg/m2  SpO2 100% No data found.   Physical Exam  Constitutional: She appears well-developed and well-nourished. No distress.  HENT:  Head: Normocephalic and atraumatic.  Right Ear: Tympanic membrane, external ear and ear canal normal.  Left Ear: External ear and ear canal normal. Tympanic membrane is erythematous and bulging. A middle ear effusion is present.  Nose: Mucosal edema and rhinorrhea present. No nose lacerations, sinus tenderness, nasal deformity, septal deviation or nasal septal hematoma. No epistaxis.  No foreign bodies. Right sinus exhibits maxillary sinus tenderness and frontal sinus tenderness. Left sinus exhibits maxillary sinus tenderness and frontal sinus tenderness.  Mouth/Throat: Uvula is midline and mucous membranes are normal. Oropharyngeal exudate and posterior oropharyngeal erythema present. No posterior oropharyngeal edema or tonsillar abscesses.  Eyes: Conjunctivae and EOM are normal. Pupils are equal, round, and reactive to light. Right  eye exhibits no discharge. Left eye exhibits no discharge. No scleral icterus.  Neck: Normal range of motion. Neck supple. No thyromegaly present.  Cardiovascular: Normal rate, regular rhythm and normal heart sounds.   Pulmonary/Chest: Effort normal and breath sounds normal. No respiratory distress. She has no wheezes. She has no rales.  Lymphadenopathy:    She has no cervical adenopathy.  Skin: No rash noted. She is not  diaphoretic.  Nursing note and vitals reviewed.   ED Course  Procedures (including critical care time)  Labs Review Labs Reviewed  RAPID STREP SCREEN (NOT AT [facility]) - Abnormal; Notable for the following:    Streptococcus, Group A Screen (Direct) POSITIVE (*)    All other components within normal limits    Imaging Review No results found.   Visual Acuity Review  Right Eye Distance:   Left Eye Distance:   Bilateral Distance:    Right Eye Near:   Left Eye Near:    Bilateral Near:         MDM   1. Strep pharyngitis   2. Acute serous otitis media of left ear, recurrence not specified    Discharge Medication List as of 03/28/2016  2:18 PM    START taking these medications   Details  azithromycin (ZITHROMAX Z-PAK) 250 MG tablet 2 tabs po once day 1, then 1 tab po qd for next 4 days, Normal    lidocaine (XYLOCAINE) 2 % solution Use as directed 20 mLs in the mouth or throat every 6 (six) hours as needed (throat pain)., Starting 03/28/2016, Until Discontinued, Normal       1. Lab results and diagnosis reviewed with patient 2. rx as per orders above; reviewed possible side effects, interactions, risks and benefits  3. Recommend supportive treatment with otc analgesics, increased fluids, salt water gargles 4. Follow-up prn if symptoms worsen or don't improve    Payton Mccallum, MD 03/28/16 1431

## 2016-03-28 NOTE — ED Notes (Signed)
Patient c/o x yesterday painful to swallow.

## 2016-05-20 ENCOUNTER — Other Ambulatory Visit: Payer: Self-pay | Admitting: Internal Medicine

## 2016-05-20 DIAGNOSIS — Z1231 Encounter for screening mammogram for malignant neoplasm of breast: Secondary | ICD-10-CM

## 2016-06-04 ENCOUNTER — Ambulatory Visit
Admission: RE | Admit: 2016-06-04 | Discharge: 2016-06-04 | Disposition: A | Discharge status: Home / Self Care | Payer: BLUE CROSS/BLUE SHIELD | Source: Ambulatory Visit | Attending: Internal Medicine | Admitting: Internal Medicine

## 2016-06-04 ENCOUNTER — Other Ambulatory Visit: Payer: Self-pay | Admitting: Internal Medicine

## 2016-06-04 DIAGNOSIS — Z1231 Encounter for screening mammogram for malignant neoplasm of breast: Secondary | ICD-10-CM | POA: Diagnosis present

## 2016-12-20 ENCOUNTER — Encounter: Payer: Self-pay | Admitting: *Deleted

## 2016-12-20 ENCOUNTER — Ambulatory Visit
Admission: EM | Admit: 2016-12-20 | Discharge: 2016-12-20 | Disposition: A | Discharge status: Home / Self Care | Payer: Self-pay | Attending: Family Medicine | Admitting: Family Medicine

## 2016-12-20 DIAGNOSIS — R109 Unspecified abdominal pain: Principal | ICD-10-CM

## 2016-12-20 DIAGNOSIS — R1030 Lower abdominal pain, unspecified: Secondary | ICD-10-CM

## 2016-12-20 DIAGNOSIS — R319 Hematuria, unspecified: Secondary | ICD-10-CM

## 2016-12-20 DIAGNOSIS — R102 Pelvic and perineal pain: Secondary | ICD-10-CM

## 2016-12-20 LAB — URINALYSIS, COMPLETE (UACMP) WITH MICROSCOPIC
BACTERIA UA: NONE SEEN
Bilirubin Urine: NEGATIVE
Glucose, UA: NEGATIVE mg/dL
Ketones, ur: NEGATIVE mg/dL
Leukocytes, UA: NEGATIVE
Nitrite: NEGATIVE
PH: 7 (ref 5.0–8.0)
PROTEIN: NEGATIVE mg/dL
RBC / HPF: NONE SEEN RBC/hpf (ref 0–5)
SPECIFIC GRAVITY, URINE: 1.02 (ref 1.005–1.030)

## 2016-12-20 MED ORDER — TRAMADOL HCL 50 MG PO TABS: 50.0000 mg | ORAL_TABLET | Freq: Four times a day (QID) | ORAL | 0 refills | Status: DC | PRN

## 2016-12-20 MED ORDER — TAMSULOSIN HCL 0.4 MG PO CAPS: 0.4000 mg | ORAL_CAPSULE | Freq: Every day | ORAL | 0 refills | Status: DC

## 2016-12-20 MED ORDER — ONDANSETRON 8 MG PO TBDP: 8.0000 mg | ORAL_TABLET | Freq: Three times a day (TID) | ORAL | 0 refills | Status: DC | PRN

## 2016-12-20 NOTE — ED Triage Notes (Signed)
Dysuria, urinary freq/ urg, low back pain, supra-pubic pain, x 2 days.
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
## 2016-12-21 LAB — URINE CULTURE

## 2017-04-22 ENCOUNTER — Other Ambulatory Visit: Payer: Self-pay | Admitting: Obstetrics and Gynecology

## 2017-04-22 ENCOUNTER — Other Ambulatory Visit: Payer: Self-pay | Admitting: Internal Medicine

## 2017-04-22 DIAGNOSIS — Z1231 Encounter for screening mammogram for malignant neoplasm of breast: Secondary | ICD-10-CM

## 2017-05-30 DIAGNOSIS — E538 Deficiency of other specified B group vitamins: Secondary | ICD-10-CM | POA: Insufficient documentation

## 2017-08-04 ENCOUNTER — Ambulatory Visit
Admission: RE | Admit: 2017-08-04 | Discharge: 2017-08-04 | Disposition: A | Discharge status: Home / Self Care | Payer: BLUE CROSS/BLUE SHIELD | Source: Ambulatory Visit | Attending: Obstetrics and Gynecology | Admitting: Obstetrics and Gynecology

## 2017-08-04 DIAGNOSIS — Z1231 Encounter for screening mammogram for malignant neoplasm of breast: Secondary | ICD-10-CM | POA: Diagnosis not present

## 2018-03-16 ENCOUNTER — Ambulatory Visit
Admission: RE | Admit: 2018-03-16 | Discharge: 2018-03-16 | Disposition: A | Discharge status: Home / Self Care | Payer: BLUE CROSS/BLUE SHIELD | Source: Ambulatory Visit | Attending: Family Medicine | Admitting: Family Medicine

## 2018-03-16 ENCOUNTER — Ambulatory Visit
Admission: EM | Admit: 2018-03-16 | Discharge: 2018-03-16 | Disposition: A | Discharge status: Home / Self Care | Payer: BLUE CROSS/BLUE SHIELD | Attending: Family Medicine | Admitting: Family Medicine

## 2018-03-16 ENCOUNTER — Encounter: Payer: Self-pay | Admitting: Emergency Medicine

## 2018-03-16 ENCOUNTER — Other Ambulatory Visit: Payer: Self-pay

## 2018-03-16 DIAGNOSIS — R9389 Abnormal findings on diagnostic imaging of other specified body structures: Secondary | ICD-10-CM | POA: Diagnosis not present

## 2018-03-16 DIAGNOSIS — R102 Pelvic and perineal pain: Secondary | ICD-10-CM | POA: Diagnosis not present

## 2018-03-16 DIAGNOSIS — R3915 Urgency of urination: Secondary | ICD-10-CM | POA: Diagnosis not present

## 2018-03-16 LAB — URINALYSIS, COMPLETE (UACMP) WITH MICROSCOPIC
Bacteria, UA: NONE SEEN
GLUCOSE, UA: NEGATIVE mg/dL
Hgb urine dipstick: NEGATIVE
KETONES UR: NEGATIVE mg/dL
LEUKOCYTES UA: NEGATIVE
Nitrite: NEGATIVE
PH: 5.5 (ref 5.0–8.0)
RBC / HPF: NONE SEEN RBC/hpf (ref 0–5)

## 2018-03-16 MED ORDER — IBUPROFEN 800 MG PO TABS: 800.0000 mg | ORAL_TABLET | Freq: Three times a day (TID) | ORAL | 0 refills | Status: DC | PRN

## 2018-03-16 NOTE — Discharge Instructions (Signed)
We will call with the results.  Take care  Dr. Simora Dingee  

## 2018-03-16 NOTE — ED Triage Notes (Signed)
Patient reports urinary urgency and pressure that started couple of days ago.

## 2018-03-16 NOTE — ED Provider Notes (Signed)
[facility]  CSN: [id number] Arrival date & time: 03/16/18  1811  History   Chief Complaint Chief Complaint  Patient presents with  . Dysuria   HPI  57 year old female presents with concerns for UTI.  Patient reports a 2-3-day history of suprapubic pain and urgency.  She reports decreased urine output when she urinates.  She has had no fever.  No back pain or flank pain.  She does state that her suprapubic pain has been quite severe at times.  She was recently diagnosed with microscopic hematuria and will be seeing a urologist next week.  No known exacerbating factors.  No other associated symptoms.  No other complaints.  Past Medical History:  Diagnosis Date  . Dysmenorrhea   . GERD (gastroesophageal reflux disease)   . IBS (irritable bowel syndrome)   . Menorrhagia   . Migraine   . PMB (postmenopausal bleeding)    Past Surgical History:  Procedure Laterality Date  . BREAST EXCISIONAL BIOPSY Left    OB History   None    Home Medications    Prior to Admission medications   Medication Sig Start Date End Date Taking? Authorizing Provider  pantoprazole (PROTONIX) 40 MG tablet Take 40 mg by mouth daily.   Yes [provider]  cyanocobalamin 500 MCG tablet Take 500 mcg by mouth daily.    [provider]  ferrous sulfate 325 (65 FE) MG EC tablet Take 325 mg by mouth 3 (three) times daily with meals.    [provider]  ibuprofen (ADVIL,MOTRIN) 800 MG tablet Take 1 tablet (800 mg total) by mouth every 8 (eight) hours as needed. 03/16/18   Tommie Sams, DO  lidocaine (XYLOCAINE) 2 % solution Use as directed 20 mLs in the mouth or throat every 6 (six) hours as needed (throat pain). 03/28/16   Payton Mccallum, MD  ondansetron (ZOFRAN ODT) 8 MG disintegrating tablet Take 1 tablet (8 mg total) by mouth every 8 (eight) hours as needed for nausea or vomiting. 12/20/16   Hassan Rowan, MD  ondansetron (ZOFRAN) 8 MG tablet Take by mouth every 8 (eight)  hours as needed for nausea or vomiting.    [provider]   Family History Family History  Problem Relation Age of Onset  . Ovarian cancer Maternal Aunt 53  . Breast cancer Other   . Breast cancer Other    Social History Social History   Tobacco Use  . Smoking status: Former Games developer  . Smokeless tobacco: Never Used  Substance Use Topics  . Alcohol use: No  . Drug use: No   Allergies   Codeine and Amoxicillin  Review of Systems Review of Systems  Constitutional: Negative for fever.  Gastrointestinal: Positive for abdominal pain.  Genitourinary: Positive for urgency.   Physical Exam Triage Vital Signs ED Triage Vitals  Enc Vitals Group     BP 03/16/18 1827 121/75     Pulse Rate 03/16/18 1827 71     Resp 03/16/18 1827 16     Temp 03/16/18 1827 98.1 F (36.7 C)     Temp Source 03/16/18 1827 Oral     SpO2 03/16/18 1827 99 %     Weight 03/16/18 1824 198 lb (89.8 kg)     Height 03/16/18 1824 5\' 11"  (1.803 m)     Head Circumference --      Peak Flow --      Pain Score 03/16/18 1824 6     Pain Loc --  Pain Edu? --      Excl. in GC? --    Updated Vital Signs BP 121/75 (BP Location: Left Arm)   Pulse 71   Temp 98.1 F (36.7 C) (Oral)   Resp 16   Ht 5\' 11"  (1.803 m)   Wt 198 lb (89.8 kg)   SpO2 99%   BMI 27.62 kg/m   Physical Exam  Constitutional: She is oriented to person, place, and time. She appears well-developed. No distress.  Cardiovascular: Normal rate and regular rhythm.  Pulmonary/Chest: Effort normal and breath sounds normal. She has no wheezes. She has no rales.  Abdominal:  Soft, nondistended.  Tender to palpation in the suprapubic region.  No CVA tenderness.  Neurological: She is alert and oriented to person, place, and time.  Psychiatric: She has a normal mood and affect. Her behavior is normal.  Nursing note and vitals reviewed.  UC Treatments / Results  Labs (all labs ordered are listed, but only abnormal results are  displayed) Labs Reviewed  URINALYSIS, COMPLETE (UACMP) WITH MICROSCOPIC - Abnormal; Notable for the following components:      Result Value   Specific Gravity, Urine >1.030 (*)    Bilirubin Urine SMALL (*)    Protein, ur TRACE (*)    Squamous Epithelial / LPF 6-30 (*)    All other components within normal limits  URINE CULTURE    EKG None Radiology No results found.  Procedures Procedures (including critical care time)  Medications Ordered in UC Medications - No data to display   Initial Impression / Assessment and Plan / UC Course  I have reviewed the triage vital signs and the nursing notes.  Pertinent labs & imaging results that were available during my care of the patient were reviewed by me and considered in my medical decision making (see chart for details).     57 year old female presents with urinary symptoms and suprapubic pain.  Her urinalysis is unremarkable.  Sending culture.  Patient is concerned given her pain and the fact that her urine is negative.  She would like a pelvic ultrasound.  Arranging.  Ibuprofen as needed for pain.  Final Clinical Impressions(s) / UC Diagnoses   Final diagnoses:  Suprapubic pain    ED Discharge Orders        Ordered    US PELVIC COMPLETE WITH TRANSVAGINAL  Status:  Canceled     03/16/18 1858    US PELVIC COMPLETE WITH TRANSVAGINAL     03/16/18 1902    ibuprofen (ADVIL,MOTRIN) 800 MG tablet  Every 8 hours PRN     03/16/18 1903     Controlled Substance Prescriptions [place] Controlled Substance Registry consulted? Not Applicable   Tommie SamsCook, Joseff Luckman G, DO 03/16/18 [phone]

## 2018-03-17 ENCOUNTER — Ambulatory Visit
Admission: RE | Admit: 2018-03-17 | Discharge: 2018-03-17 | Disposition: A | Discharge status: Home / Self Care | Payer: BLUE CROSS/BLUE SHIELD | Source: Ambulatory Visit | Attending: Obstetrics and Gynecology | Admitting: Obstetrics and Gynecology

## 2018-03-17 ENCOUNTER — Other Ambulatory Visit: Payer: Self-pay | Admitting: Obstetrics and Gynecology

## 2018-03-17 DIAGNOSIS — R319 Hematuria, unspecified: Secondary | ICD-10-CM

## 2018-03-17 DIAGNOSIS — R102 Pelvic and perineal pain: Secondary | ICD-10-CM

## 2018-03-17 DIAGNOSIS — K449 Diaphragmatic hernia without obstruction or gangrene: Secondary | ICD-10-CM | POA: Diagnosis not present

## 2018-03-17 DIAGNOSIS — N7011 Chronic salpingitis: Secondary | ICD-10-CM | POA: Insufficient documentation

## 2018-03-17 DIAGNOSIS — N200 Calculus of kidney: Secondary | ICD-10-CM | POA: Diagnosis not present

## 2018-03-17 DIAGNOSIS — N7093 Salpingitis and oophoritis, unspecified: Secondary | ICD-10-CM

## 2018-03-17 MED ORDER — IOPAMIDOL (ISOVUE-300) INJECTION 61%
125.0000 mL | Freq: Once | INTRAVENOUS | Status: AC | PRN
  Administered 2018-03-17: 125 mL via INTRAVENOUS

## 2018-03-18 LAB — URINE CULTURE

## 2018-03-24 ENCOUNTER — Ambulatory Visit
Admission: RE | Admit: 2018-03-24 | Discharge: 2018-03-24 | Disposition: A | Discharge status: Home / Self Care | Payer: BLUE CROSS/BLUE SHIELD | Source: Ambulatory Visit | Attending: Urology | Admitting: Urology

## 2018-03-24 ENCOUNTER — Ambulatory Visit: Payer: BLUE CROSS/BLUE SHIELD | Admitting: Urology

## 2018-03-24 ENCOUNTER — Encounter: Payer: Self-pay | Admitting: Urology

## 2018-03-24 VITALS — BP 117/74 | HR 74 | Ht 70.0 in | Wt 200.0 lb

## 2018-03-24 DIAGNOSIS — R3129 Other microscopic hematuria: Secondary | ICD-10-CM | POA: Diagnosis not present

## 2018-03-24 DIAGNOSIS — N200 Calculus of kidney: Secondary | ICD-10-CM

## 2018-03-24 DIAGNOSIS — N950 Postmenopausal bleeding: Secondary | ICD-10-CM | POA: Insufficient documentation

## 2018-03-24 DIAGNOSIS — K319 Disease of stomach and duodenum, unspecified: Secondary | ICD-10-CM | POA: Insufficient documentation

## 2018-03-24 DIAGNOSIS — K589 Irritable bowel syndrome without diarrhea: Secondary | ICD-10-CM | POA: Insufficient documentation

## 2018-03-24 DIAGNOSIS — G43909 Migraine, unspecified, not intractable, without status migrainosus: Secondary | ICD-10-CM | POA: Insufficient documentation

## 2018-03-24 DIAGNOSIS — Z8742 Personal history of other diseases of the female genital tract: Secondary | ICD-10-CM | POA: Insufficient documentation

## 2018-03-24 NOTE — Progress Notes (Signed)
03/24/2018 10:58 AM   Vanessa Walker 12/07/1960 [id number]  Referring provider: Leotis ShamesSingh, Jasmine, MD [phone] [facility] [address] [facility] [place] [place], [place] [id number]  Chief Complaint  Patient presents with  . Hematuria    New Patient    HPI: Vanessa Walker is a 57 year old female seen in consultation at the request of Dr. Thedore MinsSingh for evaluation of hematuria.  She had an insurance physical which apparently showed positive blood on dipstick.  She had a urinalysis on 03/03/2018 which showed large blood on dipstick and 10-50 RBCs on microscopy however there was an abnormal amount of squamous epithelial cells present on the specimen. A urine culture grew mixed urogenital flora.  She was asymptomatic however she was seen in urgent care on 03/16/2018 with a 2-3-day history of suprapubic pain and urgency.  The pain was intermittently severe.  She had had a previous history of a tubo-ovarian abscess in 2015 and stated her symptoms felt similar to that previous episode.  A pelvic ultrasound was performed which showed a possible left hydrosalpinx.  Urinalysis at that visit showed no hematuria and urine culture grew mixed flora.  She was seen by gynecology and started on Flagyl and Levaquin.  A CT of the abdomen pelvis with and without contrast was ordered which showed a 4 mm, nonobstructing left renal calculus and mild left hydrosalpinx.  She states on antibiotic therapy her symptoms have almost completely resolved.   PMH: Past Medical History:  Diagnosis Date  . Dysmenorrhea   . GERD (gastroesophageal reflux disease)   . IBS (irritable bowel syndrome)   . Menorrhagia   . Migraine   . PMB (postmenopausal bleeding)     Surgical History: Past Surgical History:  Procedure Laterality Date  . BREAST EXCISIONAL BIOPSY Left     Home Medications:  Allergies as of 03/24/2018      Reactions   Codeine Shortness Of Breath, Rash   Amoxicillin Rash   vomitting      Medication List        Accurate as of 03/24/18 10:58 AM. Always use your most recent med list.          ferrous sulfate 325 (65 FE) MG EC tablet Take 325 mg by mouth 3 (three) times daily with meals.   ibuprofen 800 MG tablet Commonly known as:  ADVIL,MOTRIN Take 1 tablet (800 mg total) by mouth every 8 (eight) hours as needed.   pantoprazole 40 MG tablet Commonly known as:  PROTONIX Take 40 mg by mouth daily.   vitamin B-12 500 MCG tablet Commonly known as:  CYANOCOBALAMIN Take 500 mcg by mouth daily.       Allergies:  Allergies  Allergen Reactions  . Codeine Shortness Of Breath and Rash  . Amoxicillin Rash    vomitting    Family History: Family History  Problem Relation Age of Onset  . Ovarian cancer Maternal Aunt 53  . Breast cancer Other   . Breast cancer Other     Social History:  reports that she has quit smoking. She has never used smokeless tobacco. She reports that she does not drink alcohol or use drugs.  ROS: UROLOGY Frequent Urination?: No Hard to postpone urination?: Yes Burning/pain with urination?: No Get up at night to urinate?: Yes Leakage of urine?: No Urine stream starts and stops?: Yes Trouble starting stream?: No Do you have to strain to urinate?: No Blood in urine?: No Urinary tract infection?: No Sexually transmitted disease?: No Injury to kidneys or bladder?: No  Painful intercourse?: No Weak stream?: Yes Currently pregnant?: No Vaginal bleeding?: No Last menstrual period?: n  Gastrointestinal Nausea?: Yes Vomiting?: No Indigestion/heartburn?: Yes Diarrhea?: Yes Constipation?: Yes  Constitutional Fever: No Night sweats?: No Weight loss?: No Fatigue?: No  Skin Skin rash/lesions?: No Itching?: No  Eyes Blurred vision?: No Double vision?: No  Ears/Nose/Throat Sore throat?: No Sinus problems?: No  Hematologic/Lymphatic Swollen glands?: No Easy bruising?: No  Cardiovascular Leg swelling?: No Chest pain?: No  Respiratory Cough?:  No Shortness of breath?: No  Endocrine Excessive thirst?: No  Musculoskeletal Back pain?: No Joint pain?: No  Neurological Headaches?: Yes Dizziness?: No  Psychologic Depression?: No Anxiety?: Yes  Physical Exam: BP 117/74   Pulse 74   Ht 5\' 10"  (1.778 m)   Wt 200 lb (90.7 kg)   BMI 28.70 kg/m   Constitutional:  Alert and oriented, No acute distress. HEENT: [place] AT, moist mucus membranes.  Trachea midline, no masses. Cardiovascular: No clubbing, cyanosis, or edema. Respiratory: Normal respiratory effort, no increased work of breathing. GI: Abdomen is soft, nontender, nondistended, no abdominal masses GU: No CVA tenderness Lymph: No cervical or inguinal lymphadenopathy. Skin: No rashes, bruises or suspicious lesions. Neurologic: Grossly intact, no focal deficits, moving all 4 extremities. Psychiatric: Normal mood and affect.  Laboratory Data: Lab Results  Component Value Date   WBC 12.1 (H) 11/29/2014   HGB 10.0 (L) 11/29/2014   HCT 30.0 (L) 11/29/2014   MCV 100 11/29/2014   PLT 418 11/29/2014    Lab Results  Component Value Date   CREATININE 0.91 11/29/2014    Urinalysis Negative for blood on dipstick or microscopy.   Pertinent Imaging: CT images reviewed   Assessment & Plan:   Her previous urinalysis showing microhematuria had significant squamous epithelial contamination and would not be considered reliable.  CT does show a nonobstructing 4 mm renal calculus which was discussed.  Her last 2 urinalysis have not shown microhematuria.  She denies previous history of stone disease.  We discussed stone prevention guidelines and she was provided literature.  I have recommended a follow-up visit with KUB in 3 months and a repeat urinalysis.  If she has recurrent pelvic symptoms or has significant microhematuria will proceed with cystoscopy.  Return in about 3 months (around 06/23/2018) for Recheck, KUB.    Riki Altes, MD  [facility] [address] [place], [place] [address] [phone]

## 2018-03-25 ENCOUNTER — Telehealth: Payer: Self-pay | Admitting: Family Medicine

## 2018-03-25 LAB — MICROSCOPIC EXAMINATION
Epithelial Cells (non renal): >10 /hpf — ABNORMAL HIGH (ref 0–10)
RBC, UA: NONE SEEN /hpf (ref 0–2)

## 2018-03-25 LAB — URINALYSIS, COMPLETE
Bilirubin, UA: NEGATIVE
GLUCOSE, UA: NEGATIVE
Ketones, UA: NEGATIVE
Leukocytes, UA: NEGATIVE
Nitrite, UA: NEGATIVE
Protein, UA: NEGATIVE
RBC, UA: NEGATIVE
Specific Gravity, UA: 1.025 (ref 1.005–1.030)
Urobilinogen, Ur: 0.2 mg/dL (ref 0.2–1.0)
pH, UA: 6 (ref 5.0–7.5)

## 2018-03-25 NOTE — Telephone Encounter (Signed)
Patient notified

## 2018-03-25 NOTE — Telephone Encounter (Signed)
-----   Message from Riki AltesScott C Stoioff, MD sent at 03/25/2018  1:06 PM EDT ----- The left renal calculus was difficult to identify on KUB.  Follow-up as scheduled.

## 2018-06-21 ENCOUNTER — Ambulatory Visit: Payer: BLUE CROSS/BLUE SHIELD | Admitting: Urology

## 2018-06-21 ENCOUNTER — Encounter: Payer: Self-pay | Admitting: Urology

## 2018-06-24 ENCOUNTER — Other Ambulatory Visit: Payer: Self-pay

## 2018-06-24 ENCOUNTER — Ambulatory Visit
Admission: EM | Admit: 2018-06-24 | Discharge: 2018-06-24 | Disposition: A | Discharge status: Home / Self Care | Payer: BLUE CROSS/BLUE SHIELD | Attending: Family Medicine | Admitting: Family Medicine

## 2018-06-24 ENCOUNTER — Encounter: Payer: Self-pay | Admitting: Emergency Medicine

## 2018-06-24 DIAGNOSIS — J0100 Acute maxillary sinusitis, unspecified: Secondary | ICD-10-CM

## 2018-06-24 DIAGNOSIS — R05 Cough: Secondary | ICD-10-CM

## 2018-06-24 DIAGNOSIS — R059 Cough, unspecified: Secondary | ICD-10-CM

## 2018-06-24 DIAGNOSIS — R0981 Nasal congestion: Secondary | ICD-10-CM

## 2018-06-24 LAB — RAPID STREP SCREEN (MED CTR MEBANE ONLY): STREPTOCOCCUS, GROUP A SCREEN (DIRECT): NEGATIVE

## 2018-06-24 MED ORDER — DOXYCYCLINE HYCLATE 100 MG PO TABS: 100.0000 mg | ORAL_TABLET | Freq: Two times a day (BID) | ORAL | 0 refills | Status: DC

## 2018-06-24 NOTE — ED Triage Notes (Signed)
Patient in today c/o nasal congestion, productive cough (green), left ear pain and sore throat x 6 days. Patient had a low grade fever yesterday of 99. Patient has used OTC Tylenol and pineapple juice and cough drops.

## 2018-06-24 NOTE — Discharge Instructions (Signed)
Flonase steroid nose spray °

## 2018-06-24 NOTE — ED Provider Notes (Signed)
[facility]    CSN: [id number] Arrival date & time: 06/24/18  1039     History   Chief Complaint Chief Complaint  Patient presents with  . Nasal Congestion  . Cough    HPI Vanessa Walker is a 57 y.o. female.   The history is provided by the patient.  URI  Presenting symptoms: congestion, cough, facial pain and rhinorrhea   Severity:  Moderate Onset quality:  Sudden Duration:  1 week Timing:  Constant Progression:  Worsening Chronicity:  New Relieved by:  Nothing Worsened by:  Nothing Ineffective treatments:  None tried Associated symptoms: no arthralgias, no headaches, no myalgias, no neck pain, no sinus pain, no sneezing, no swollen glands and no wheezing   Risk factors: sick contacts   Risk factors: not elderly, no chronic cardiac disease, no chronic kidney disease, no chronic respiratory disease, no diabetes mellitus, no immunosuppression, no recent illness and no recent travel     Past Medical History:  Diagnosis Date  . Dysmenorrhea   . GERD (gastroesophageal reflux disease)   . IBS (irritable bowel syndrome)   . Menorrhagia   . Migraine   . PMB (postmenopausal bleeding)     Patient Active Problem List   Diagnosis Date Noted  . History of dysmenorrhea 03/24/2018  . History of menorrhagia 03/24/2018  . Irritable bowel syndrome 03/24/2018  . Migraines 03/24/2018  . PMB (postmenopausal bleeding) 03/24/2018  . Stomach problems 03/24/2018  . Vitamin B 12 deficiency 05/30/2017  . Body mass index (BMI) of 32.0-32.9 in adult 01/29/2016  . Excessive weight gain 01/29/2016  . Gastroesophageal reflux disease without esophagitis 01/29/2016  . Right leg swelling 01/29/2016  . Abdominal pain 11/26/2014  . Adnexal mass 10/30/2014  . Abnormal uterine bleeding (AUB) 10/30/2014  . Abnormal pelvic ultrasound 06/28/2013    Past Surgical History:  Procedure Laterality Date  . BREAST EXCISIONAL BIOPSY Left     OB History   None      Home  Medications    Prior to Admission medications   Medication Sig Start Date End Date Taking? Authorizing Provider  cyanocobalamin 500 MCG tablet Take 500 mcg by mouth daily.   Yes [provider]  pantoprazole (PROTONIX) 40 MG tablet Take 40 mg by mouth daily.   Yes [provider]  doxycycline (VIBRA-TABS) 100 MG tablet Take 1 tablet (100 mg total) by mouth 2 (two) times daily. 06/24/18   Payton Mccallumonty, Gabriel Paulding, MD  ferrous sulfate 325 (65 FE) MG EC tablet Take 325 mg by mouth 3 (three) times daily with meals.    [provider]  ibuprofen (ADVIL,MOTRIN) 800 MG tablet Take 1 tablet (800 mg total) by mouth every 8 (eight) hours as needed. 03/16/18   Tommie Samsook, Jayce G, DO    Family History Family History  Problem Relation Age of Onset  . Ovarian cancer Maternal Aunt 53  . Breast cancer Other   . Breast cancer Other   . Fibromyalgia Mother   . Rheum arthritis Mother   . Arrhythmia Mother        PVCs  . Heart attack Father   . Diabetes Father   . Hypertension Father     Social History Social History   Tobacco Use  . Smoking status: Former Smoker    Last attempt to quit: 06/25/1983    Years since quitting: 35.0  . Smokeless tobacco: Never Used  Substance Use Topics  . Alcohol use: No  . Drug use: No  Allergies   Codeine and Amoxicillin   Review of Systems Review of Systems  HENT: Positive for congestion and rhinorrhea. Negative for sinus pain and sneezing.   Respiratory: Positive for cough. Negative for wheezing.   Musculoskeletal: Negative for arthralgias, myalgias and neck pain.  Neurological: Negative for headaches.     Physical Exam Triage Vital Signs ED Triage Vitals [06/24/18 1058]  Enc Vitals Group     BP 123/78     Pulse Rate 65     Resp 16     Temp 97.8 F (36.6 C)     Temp Source Oral     SpO2 100 %     Weight 210 lb (95.3 kg)     Height 5' 10.5" (1.791 m)     Head Circumference      Peak Flow      Pain Score 1     Pain Loc       Pain Edu?      Excl. in GC?    No data found.  Updated Vital Signs BP 123/78 (BP Location: Left Arm)   Pulse 65   Temp 97.8 F (36.6 C) (Oral)   Resp 16   Ht 5' 10.5" (1.791 m)   Wt 210 lb (95.3 kg)   SpO2 100%   BMI 29.71 kg/m   Visual Acuity Right Eye Distance:   Left Eye Distance:   Bilateral Distance:    Right Eye Near:   Left Eye Near:    Bilateral Near:     Physical Exam  Constitutional: She appears well-developed and well-nourished. No distress.  HENT:  Head: Normocephalic and atraumatic.  Right Ear: Tympanic membrane, external ear and ear canal normal.  Left Ear: Tympanic membrane, external ear and ear canal normal.  Nose: Mucosal edema and rhinorrhea present. No nose lacerations, sinus tenderness, nasal deformity, septal deviation or nasal septal hematoma. No epistaxis.  No foreign bodies. Right sinus exhibits maxillary sinus tenderness and frontal sinus tenderness. Left sinus exhibits maxillary sinus tenderness and frontal sinus tenderness.  Mouth/Throat: Uvula is midline, oropharynx is clear and moist and mucous membranes are normal. No oropharyngeal exudate. No tonsillar exudate.  Neck: Normal range of motion. Neck supple. No tracheal deviation present. No thyromegaly present.  Cardiovascular: Normal rate, regular rhythm and normal heart sounds.  Pulmonary/Chest: Effort normal and breath sounds normal. No stridor. No respiratory distress. She has no wheezes. She has no rales.  Lymphadenopathy:    She has no cervical adenopathy.  Skin: She is not diaphoretic.  Nursing note and vitals reviewed.    UC Treatments / Results  Labs (all labs ordered are listed, but only abnormal results are displayed) Labs Reviewed  RAPID STREP SCREEN ([facility] ONLY)  CULTURE, GROUP A STREP [facility])    EKG None  Radiology No results found.  Procedures Procedures (including critical care time)  Medications Ordered in UC Medications - No data to  display  Initial Impression / Assessment and Plan / UC Course  I have reviewed the triage vital signs and the nursing notes.  Pertinent labs & imaging results that were available during my care of the patient were reviewed by me and considered in my medical decision making (see chart for details).      Final Clinical Impressions(s) / UC Diagnoses   Final diagnoses:  Acute maxillary sinusitis, recurrence not specified  Cough     Discharge Instructions     Flonase steroid nose spray    ED Prescriptions  Medication Sig Dispense Auth. Provider   doxycycline (VIBRA-TABS) 100 MG tablet Take 1 tablet (100 mg total) by mouth 2 (two) times daily. 20 tablet Payton Mccallum, MD     1. diagnosis reviewed with patient 2. rx as per orders above; reviewed possible side effects, interactions, risks and benefits  3. Recommend supportive treatment as above 4. Follow-up prn if symptoms worsen or don't improve  Controlled Substance Prescriptions [place] Controlled Substance Registry consulted? Not Applicable   Payton Mccallum, MD 06/24/18 1200

## 2018-06-26 LAB — CULTURE, GROUP A STREP (THRC)

## 2018-06-28 ENCOUNTER — Other Ambulatory Visit: Payer: Self-pay | Admitting: Family Medicine

## 2018-06-28 DIAGNOSIS — Z1231 Encounter for screening mammogram for malignant neoplasm of breast: Secondary | ICD-10-CM

## 2018-08-22 ENCOUNTER — Ambulatory Visit
Admission: RE | Admit: 2018-08-22 | Discharge: 2018-08-22 | Disposition: A | Discharge status: Home / Self Care | Payer: BLUE CROSS/BLUE SHIELD | Source: Ambulatory Visit | Attending: Family Medicine | Admitting: Family Medicine

## 2018-08-22 DIAGNOSIS — Z1231 Encounter for screening mammogram for malignant neoplasm of breast: Secondary | ICD-10-CM | POA: Diagnosis not present

## 2018-11-09 DIAGNOSIS — N7011 Chronic salpingitis: Secondary | ICD-10-CM | POA: Insufficient documentation

## 2019-01-09 ENCOUNTER — Ambulatory Visit
Admission: EM | Admit: 2019-01-09 | Discharge: 2019-01-09 | Disposition: A | Discharge status: Home / Self Care | Payer: BLUE CROSS/BLUE SHIELD | Attending: Family Medicine | Admitting: Family Medicine

## 2019-01-09 ENCOUNTER — Encounter: Payer: Self-pay | Admitting: Emergency Medicine

## 2019-01-09 ENCOUNTER — Ambulatory Visit (INDEPENDENT_AMBULATORY_CARE_PROVIDER_SITE_OTHER): Payer: BLUE CROSS/BLUE SHIELD

## 2019-01-09 DIAGNOSIS — R0981 Nasal congestion: Secondary | ICD-10-CM

## 2019-01-09 DIAGNOSIS — R509 Fever, unspecified: Secondary | ICD-10-CM

## 2019-01-09 DIAGNOSIS — R0989 Other specified symptoms and signs involving the circulatory and respiratory systems: Secondary | ICD-10-CM

## 2019-01-09 DIAGNOSIS — R05 Cough: Secondary | ICD-10-CM

## 2019-01-09 DIAGNOSIS — R69 Illness, unspecified: Principal | ICD-10-CM

## 2019-01-09 DIAGNOSIS — J111 Influenza due to unidentified influenza virus with other respiratory manifestations: Secondary | ICD-10-CM

## 2019-01-09 MED ORDER — PSEUDOEPH-BROMPHEN-DM 30-2-10 MG/5ML PO SYRP: 5.0000 mL | ORAL_SOLUTION | Freq: Four times a day (QID) | ORAL | 0 refills | Status: DC | PRN

## 2019-01-09 MED ORDER — BENZONATATE 200 MG PO CAPS: ORAL_CAPSULE | ORAL | 0 refills | Status: DC

## 2019-01-09 NOTE — Discharge Instructions (Addendum)
Drink plenty of fluids.  Rest as much as possible.  Use Tylenol or Motrin for fever chills or body aches.  °

## 2019-01-09 NOTE — ED Triage Notes (Signed)
Patient c/o cough, body aches and fever that started 5 days ago. Patient has been taking Tylenol and cough drops for her symptoms.

## 2019-01-09 NOTE — ED Provider Notes (Signed)
[facility]    CSN: [id number] Arrival date & time: 01/09/19  1808     History   Chief Complaint Chief Complaint  Patient presents with  . Cough  . Fever    HPI Vanessa Walker is a 58 y.o. female.   HPI   58 year old female presents accompanied by her husband complaining of cough body aches and fever started about 5 days ago.  She been taking Tylenol and cough drops for her symptoms.  Her last Tylenol was at 1:00 this afternoon.  Temperature measurements at home have been as high as 101.  She is not had a flu shot.  Today her temperature is 98.5 pulse rate is 75 blood pressure 129/95 respirations 18 O2 sats at 97% on room air.  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      Past Medical History:  Diagnosis Date  . Dysmenorrhea   . GERD (gastroesophageal reflux disease)   . IBS (irritable bowel syndrome)   . Menorrhagia   . Migraine   . PMB (postmenopausal bleeding)     Patient Active Problem List   Diagnosis Date Noted  . History of dysmenorrhea 03/24/2018  . History of menorrhagia 03/24/2018  . Irritable bowel syndrome 03/24/2018  . Migraines 03/24/2018  . PMB (postmenopausal bleeding) 03/24/2018  . Stomach problems 03/24/2018  . Vitamin B 12 deficiency 05/30/2017  . Body mass index (BMI) of 32.0-32.9 in adult 01/29/2016  . Excessive weight gain 01/29/2016  . Gastroesophageal reflux disease without esophagitis 01/29/2016  . Right leg swelling 01/29/2016  . Abdominal pain 11/26/2014  . Adnexal mass 10/30/2014  . Abnormal uterine bleeding (AUB) 10/30/2014  . Abnormal pelvic ultrasound 06/28/2013    Past Surgical History:  Procedure Laterality Date  . BREAST EXCISIONAL BIOPSY Left 2014   benign    OB History   No obstetric history on file.      Home Medications    Prior to Admission medications   Medication Sig Start Date End Date Taking? Authorizing Provider  pantoprazole (PROTONIX) 40 MG tablet Take 40  mg by mouth daily.   Yes [provider]  benzonatate (TESSALON) 200 MG capsule Take one cap TID PRN cough 01/09/19   Lutricia Feil, PA-C  brompheniramine-pseudoephedrine-DM 30-2-10 MG/5ML syrup Take 5 mLs by mouth 4 (four) times daily as needed. 01/09/19   Lutricia Feil, PA-C    Family History Family History  Problem Relation Age of Onset  . Ovarian cancer Maternal Aunt 53  . Breast cancer Other   . Breast cancer Other   . Fibromyalgia Mother   . Rheum arthritis Mother   . Arrhythmia Mother        PVCs  . Heart attack Father   . Diabetes Father   . Hypertension Father     Social History Social History   Tobacco Use  . Smoking status: Former Smoker    Last attempt to quit: 06/25/1983    Years since quitting: 35.5  . Smokeless tobacco: Never Used  Substance Use Topics  . Alcohol use: No  . Drug use: No     Allergies   Codeine and Amoxicillin   Review of Systems Review of Systems  Constitutional: Positive for activity change, chills, fatigue and fever.  HENT: Positive for congestion.   Respiratory: Positive for cough and shortness of breath.   All other systems reviewed and are negative.    Physical Exam Triage Vital Signs ED Triage Vitals  Enc Vitals Group     BP 01/09/19 1819 (!) 129/95     Pulse Rate 01/09/19 1819 75     Resp 01/09/19 1819 18     Temp 01/09/19 1819 98.5 F (36.9 C)     Temp Source 01/09/19 1819 Oral     SpO2 01/09/19 1819 97 %     Weight 01/09/19 1820 210 lb (95.3 kg)     Height 01/09/19 1820 5' 10.5" (1.791 m)     Head Circumference --      Peak Flow --      Pain Score 01/09/19 1819 2     Pain Loc --      Pain Edu? --      Excl. in GC? --    No data found.  Updated Vital Signs BP (!) 129/95 (BP Location: Left Arm)   Pulse 75   Temp 98.5 F (36.9 C) (Oral)   Resp 18   Ht 5' 10.5" (1.791 m)   Wt 210 lb (95.3 kg)   SpO2 97%   BMI 29.71 kg/m   Visual Acuity Right Eye Distance:   Left Eye Distance:     Bilateral Distance:    Right Eye Near:   Left Eye Near:    Bilateral Near:     Physical Exam Vitals signs and nursing note reviewed.  Constitutional:      General: She is not in acute distress.    Appearance: Normal appearance. She is not ill-appearing, toxic-appearing or diaphoretic.  HENT:     Head: Normocephalic.     Right Ear: Tympanic membrane, ear canal and external ear normal.     Left Ear: Tympanic membrane and external ear normal.     Nose: Congestion present.     Mouth/Throat:     Mouth: Mucous membranes are moist.     Pharynx: No oropharyngeal exudate or posterior oropharyngeal erythema.  Eyes:     General:        Right eye: No discharge.        Left eye: No discharge.     Conjunctiva/sclera: Conjunctivae normal.  Neck:     Musculoskeletal: Normal range of motion.  Pulmonary:     Effort: Pulmonary effort is normal.     Breath sounds: Rales present.     Comments: Fine crackles in the bases Musculoskeletal: Normal range of motion.  Skin:    General: Skin is warm and dry.  Neurological:     General: No focal deficit present.     Mental Status: She is alert and oriented to person, place, and time.  Psychiatric:        Mood and Affect: Mood normal.        Behavior: Behavior normal.        Thought Content: Thought content normal.        Judgment: Judgment normal.      UC Treatments / Results  Labs (all labs ordered are listed, but only abnormal results are displayed) Labs Reviewed - No data to display  EKG None  Radiology Dg Chest 2 View  Result Date: 01/09/2019 CLINICAL DATA:  Sick for 5 days with productive cough, shortness of breath, fever, fatigue and bilateral rib pain. History of pneumonia. EXAM: CHEST - 2 VIEW COMPARISON:  None. FINDINGS: Heart size and mediastinal contours are within normal limits. Lungs are clear. No pleural effusion seen. No acute or suspicious osseous finding. No rib fracture or displacement seen. IMPRESSION: No acute  findings. No evidence of pneumonia  or pulmonary edema. No rib fracture or displacement seen. Electronically Signed   By: Bary RichardStan  Maynard M.D.   On: 01/09/2019 19:43    Procedures Procedures (including critical care time)  Medications Ordered in UC Medications - No data to display  Initial Impression / Assessment and Plan / UC Course  I have reviewed the triage vital signs and the nursing notes.  Pertinent labs & imaging results that were available during my care of the patient were reviewed by me and considered in my medical decision making (see chart for details).   Is 5 days out from her onset of symptoms which appear to be flulike.  Puts her outside the window of therapy with Tamiflu.  I offered it to her but she has declined.  Treat her symptomatically.  She is allergic to codeine so we will place her on brompheniramine Tessalon Perles.  She needs to drink plenty of fluids get adequate rest and use Tylenol or Motrin for fever chills or body aches.   Final Clinical Impressions(s) / UC Diagnoses   Final diagnoses:  Influenza-like illness     Discharge Instructions     Drink plenty of fluids.  Rest as much as possible.  Use Tylenol or Motrin for fever chills or body aches.     ED Prescriptions    Medication Sig Dispense Auth. Provider   benzonatate (TESSALON) 200 MG capsule Take one cap TID PRN cough 30 capsule Ovid Curdoemer, Delmus Warwick P, PA-C   brompheniramine-pseudoephedrine-DM 30-2-10 MG/5ML syrup Take 5 mLs by mouth 4 (four) times daily as needed. 120 mL Lutricia Feiloemer, Jaece Ducharme P, PA-C     Controlled Substance Prescriptions  Controlled Substance Registry consulted? Not Applicable   Lutricia FeilRoemer, Kailen Hinkle P, PA-C 01/09/19 2022

## 2019-01-23 DIAGNOSIS — E559 Vitamin D deficiency, unspecified: Secondary | ICD-10-CM | POA: Insufficient documentation

## 2019-04-18 ENCOUNTER — Other Ambulatory Visit: Payer: Self-pay | Admitting: Family Medicine

## 2019-04-18 DIAGNOSIS — M7989 Other specified soft tissue disorders: Secondary | ICD-10-CM

## 2019-05-08 ENCOUNTER — Ambulatory Visit: Admission: RE | Admit: 2019-05-08 | Payer: BLUE CROSS/BLUE SHIELD | Source: Ambulatory Visit

## 2019-07-17 ENCOUNTER — Other Ambulatory Visit: Payer: Self-pay | Admitting: Obstetrics and Gynecology

## 2019-07-17 DIAGNOSIS — Z1231 Encounter for screening mammogram for malignant neoplasm of breast: Secondary | ICD-10-CM

## 2019-08-02 ENCOUNTER — Other Ambulatory Visit: Payer: Self-pay | Admitting: Obstetrics and Gynecology

## 2019-08-02 ENCOUNTER — Other Ambulatory Visit (HOSPITAL_COMMUNITY): Payer: Self-pay | Admitting: Obstetrics and Gynecology

## 2019-08-02 DIAGNOSIS — R102 Pelvic and perineal pain: Secondary | ICD-10-CM

## 2019-08-02 DIAGNOSIS — N950 Postmenopausal bleeding: Secondary | ICD-10-CM

## 2019-08-02 DIAGNOSIS — N7093 Salpingitis and oophoritis, unspecified: Secondary | ICD-10-CM

## 2019-08-03 ENCOUNTER — Ambulatory Visit: Payer: BLUE CROSS/BLUE SHIELD

## 2019-10-03 ENCOUNTER — Ambulatory Visit
Admission: RE | Admit: 2019-10-03 | Discharge: 2019-10-03 | Disposition: A | Discharge status: Home / Self Care | Payer: BLUE CROSS/BLUE SHIELD | Source: Ambulatory Visit | Attending: Obstetrics and Gynecology | Admitting: Obstetrics and Gynecology

## 2019-10-03 DIAGNOSIS — Z1231 Encounter for screening mammogram for malignant neoplasm of breast: Secondary | ICD-10-CM | POA: Insufficient documentation

## 2020-02-01 ENCOUNTER — Emergency Department: Payer: 59

## 2020-02-01 ENCOUNTER — Other Ambulatory Visit: Payer: Self-pay

## 2020-02-01 ENCOUNTER — Emergency Department
Admission: EM | Admit: 2020-02-01 | Discharge: 2020-02-01 | Disposition: A | Discharge status: Home / Self Care | Payer: 59 | Attending: Emergency Medicine | Admitting: Emergency Medicine

## 2020-02-01 DIAGNOSIS — R1030 Lower abdominal pain, unspecified: Secondary | ICD-10-CM | POA: Insufficient documentation

## 2020-02-01 DIAGNOSIS — Z87891 Personal history of nicotine dependence: Secondary | ICD-10-CM | POA: Insufficient documentation

## 2020-02-01 DIAGNOSIS — Z79899 Other long term (current) drug therapy: Secondary | ICD-10-CM | POA: Diagnosis not present

## 2020-02-01 DIAGNOSIS — N939 Abnormal uterine and vaginal bleeding, unspecified: Secondary | ICD-10-CM | POA: Insufficient documentation

## 2020-02-01 LAB — URINALYSIS, COMPLETE (UACMP) WITH MICROSCOPIC
Bilirubin Urine: NEGATIVE
Glucose, UA: NEGATIVE mg/dL
Ketones, ur: NEGATIVE mg/dL
Leukocytes,Ua: NEGATIVE
Nitrite: NEGATIVE
Protein, ur: 30 mg/dL — AB
RBC / HPF: >50 RBC/hpf — ABNORMAL HIGH (ref 0–5)
Specific Gravity, Urine: 1.025 (ref 1.005–1.030)
pH: 5 (ref 5.0–8.0)

## 2020-02-01 LAB — CBC
HCT: 41.2 % (ref 36.0–46.0)
Hemoglobin: 13.5 g/dL (ref 12.0–15.0)
MCH: 32 pg (ref 26.0–34.0)
MCHC: 32.8 g/dL (ref 30.0–36.0)
MCV: 97.6 fL (ref 80.0–100.0)
Platelets: 246 10*3/uL (ref 150–400)
RBC: 4.22 MIL/uL (ref 3.87–5.11)
RDW: 13.2 % (ref 11.5–15.5)
WBC: 7.3 10*3/uL (ref 4.0–10.5)
nRBC: 0 % (ref 0.0–0.2)

## 2020-02-01 LAB — COMPREHENSIVE METABOLIC PANEL
ALT: 10 U/L (ref 0–44)
AST: 16 U/L (ref 15–41)
Albumin: 3.9 g/dL (ref 3.5–5.0)
Alkaline Phosphatase: 71 U/L (ref 38–126)
Anion gap: 6 (ref 5–15)
BUN: 19 mg/dL (ref 6–20)
CO2: 27 mmol/L (ref 22–32)
Calcium: 9 mg/dL (ref 8.9–10.3)
Chloride: 106 mmol/L (ref 98–111)
Creatinine, Ser: 0.88 mg/dL (ref 0.44–1.00)
GFR calc Af Amer: >60 mL/min (ref >60)
GFR calc non Af Amer: >60 mL/min (ref >60)
Glucose, Bld: 95 mg/dL (ref 70–99)
Potassium: 4.2 mmol/L (ref 3.5–5.1)
Sodium: 139 mmol/L (ref 135–145)
Total Bilirubin: 0.6 mg/dL (ref 0.3–1.2)
Total Protein: 7.5 g/dL (ref 6.5–8.1)

## 2020-02-01 LAB — POCT PREGNANCY, URINE: Preg Test, Ur: NEGATIVE

## 2020-02-01 LAB — LIPASE, BLOOD: Lipase: 30 U/L (ref 11–51)

## 2020-02-01 MED ORDER — NORETHINDRONE ACETATE 5 MG PO TABS: 10.0000 mg | ORAL_TABLET | Freq: Every day | ORAL | 0 refills | Status: DC

## 2020-02-01 NOTE — Consult Note (Signed)
Consult History and Physical   SERVICE: Gynecology   Patient Name: Vanessa Walker Patient MRN:   [id number]  CC: Pelvic pain, postmenopausal bleeding  HPI: Vanessa Walker is a 59 y.o. F with hx hydrosalpingx, ovarian cysts and postmenopausal bleeding presenting with sudden onset of significant vaginal bleeding and new right sided pelvic pain. She denies n/v, constipation or new diarrhea. No fever. No syncope.  Three days of worsening vaginal bleeding. Hx of embx in the past with neg results.   Review of Systems: positives in bold GEN:   fevers, chills, weight changes, appetite changes, fatigue, night sweats HEENT:  HA, vision changes, hearing loss, congestion, rhinorrhea, sinus pressure, dysphagia CV:   CP, palpitations PULM:  SOB, cough GI:  abd pain, N/V/D/C, GU:  dysuria, urgency, frequency,  vaginal bleeding MSK:  arthralgias, myalgias, back pain, swelling SKIN:  rashes, color changes, pallor NEURO:  numbness, weakness, tingling, seizures, dizziness, tremors PSYCH:  depression, anxiety, behavioral problems, confusion  HEME/LYMPH:  easy bruising or bleeding ENDO:  heat/cold intolerance  Past Obstetrical History: OB History   No obstetric history on file.     Past Gynecologic History: No LMP recorded. Patient is postmenopausal.   Past Medical History: Past Medical History:  Diagnosis Date  . Dysmenorrhea   . GERD (gastroesophageal reflux disease)   . IBS (irritable bowel syndrome)   . Menorrhagia   . Migraine   . PMB (postmenopausal bleeding)     Past Surgical History:   Past Surgical History:  Procedure Laterality Date  . BREAST EXCISIONAL BIOPSY Left 2014   benign    Family History:  family history includes Arrhythmia in her mother; Breast cancer in some other family members; Diabetes in her father; Fibromyalgia in her mother; Heart attack in her father; Hypertension in her father; Ovarian cancer (age of onset: 45) in her maternal aunt; Rheum  arthritis in her mother.  Social History:  Social History   Socioeconomic History  . Marital status: Married    Spouse name: Not on file  . Number of children: Not on file  . Years of education: Not on file  . Highest education level: Not on file  Occupational History  . Not on file  Tobacco Use  . Smoking status: Former Smoker    Quit date: 06/25/1983    Years since quitting: 36.6  . Smokeless tobacco: Never Used  Substance and Sexual Activity  . Alcohol use: No  . Drug use: No  . Sexual activity: Not on file  Other Topics Concern  . Not on file  Social History Narrative  . Not on file   Social Determinants of Health   Financial Resource Strain:   . Difficulty of Paying Living Expenses: Not on file  Food Insecurity:   . Worried About Programme researcher, broadcasting/film/video in the Last Year: Not on file  . Ran Out of Food in the Last Year: Not on file  Transportation Needs:   . Lack of Transportation (Medical): Not on file  . Lack of Transportation (Non-Medical): Not on file  Physical Activity:   . Days of Exercise per Week: Not on file  . Minutes of Exercise per Session: Not on file  Stress:   . Feeling of Stress : Not on file  Social Connections:   . Frequency of Communication with Friends and Family: Not on file  . Frequency of Social Gatherings with Friends and Family: Not on file  . Attends Religious Services: Not on file  . Active  Member of Clubs or Organizations: Not on file  . Attends Archivist Meetings: Not on file  . Marital Status: Not on file  Intimate Partner Violence:   . Fear of Current or Ex-Partner: Not on file  . Emotionally Abused: Not on file  . Physically Abused: Not on file  . Sexually Abused: Not on file    Home Medications:  Medications reconciled in EPIC  No current facility-administered medications on file prior to encounter.   Current Outpatient Medications on File Prior to Encounter  Medication Sig Dispense Refill  . benzonatate  (TESSALON) 200 MG capsule Take one cap TID PRN cough 30 capsule 0  . brompheniramine-pseudoephedrine-DM 30-2-10 MG/5ML syrup Take 5 mLs by mouth 4 (four) times daily as needed. 120 mL 0  . pantoprazole (PROTONIX) 40 MG tablet Take 40 mg by mouth daily.      Allergies:  Allergies  Allergen Reactions  . Codeine Shortness Of Breath and Rash  . Amoxicillin Rash    vomitting    Physical Exam:  Temp:  [98.2 F (36.8 C)] 98.2 F (36.8 C) (03/04 1538) Pulse Rate:  [79] 79 (03/04 1538) Resp:  [18] 18 (03/04 1538) BP: (145)/(83) 145/83 (03/04 1538) SpO2:  [98 %] 98 % (03/04 1538) Weight:  [95.3 kg] 95.3 kg (03/04 1539)   General Appearance:  Well developed, well nourished, no acute distress, alert and oriented x3 HEENT:  Normocephalic atraumatic, extraocular movements intact, moist mucous membranes Cardiovascular:  Normal S1/S2, regular rate and rhythm, no murmurs Pulmonary:  clear to auscultation, no wheezes, rales or rhonchi, symmetric air entry, good air exchange Abdomen:  Bowel sounds present, soft, diffusely mildly tender suprapubic bilateral, nondistended, no abnormal masses, no epigastric pain Extremities:  Full range of motion, no pedal edema, 2+ distal pulses, no tenderness Skin:  normal coloration and turgor, no rashes, no suspicious skin lesions noted  Neurologic:  Cranial nerves 2-12 grossly intact, normal muscle tone, strength 5/5 all four extremities Psychiatric:  Normal mood and affect, appropriate, no AH/VH Pelvic:  NEFG, no vulvar masses or lesions, normal vaginal mucosa, +blood in the vault. Abdominal tenderness throughout, limiting exam   Labs/Studies:   CBC and Coags:  Lab Results  Component Value Date   WBC 7.3 02/01/2020   NEUTOPHILPCT 78.6 11/29/2014   EOSPCT 0.6 11/29/2014   BASOPCT 0.3 11/29/2014   LYMPHOPCT 11.1 11/29/2014   HGB 13.5 02/01/2020   HCT 41.2 02/01/2020   MCV 97.6 02/01/2020   PLT 246 02/01/2020   CMP:  Lab Results  Component Value  Date   NA 139 02/01/2020   K 4.2 02/01/2020   CL 106 02/01/2020   CO2 27 02/01/2020   BUN 19 02/01/2020   CREATININE 0.88 02/01/2020   CREATININE 0.91 11/29/2014   CREATININE 1.11 11/27/2014   PROT 7.5 02/01/2020   BILITOT 0.6 02/01/2020   ALT 10 02/01/2020   AST 16 02/01/2020   ALKPHOS 71 02/01/2020    Other Imaging: US PELVIC COMPLETE WITH TRANSVAGINAL  Result Date: 02/01/2020 CLINICAL DATA:  Initial evaluation for acute vaginal bleeding, right lower quadrant pain for 4 days. EXAM: TRANSABDOMINAL AND TRANSVAGINAL ULTRASOUND OF PELVIS TECHNIQUE: Both transabdominal and transvaginal ultrasound examinations of the pelvis were performed. Transabdominal technique was performed for global imaging of the pelvis including uterus, ovaries, adnexal regions, and pelvic cul-de-sac. It was necessary to proceed with endovaginal exam following the transabdominal exam to visualize the uterus, endometrium, and ovaries. COMPARISON:  Prior ultrasound from 03/16/2018. FINDINGS: Uterus Measurements: 7.4 x  4.8 x 5.8 cm = volume: 107 mL. Uterus is retroverted. No fibroids or other mass visualized. Endometrium Thickness: 10.9 mm.  No focal abnormality visualized. Right ovary Measurements: 2.3 x 1.2 x 0.9 cm = volume: 1.0 mL. Normal appearance/no adnexal mass. Left ovary Measurements: 4.8 x 3.8 x 4.1 cm = volume: 39 mL. 4.2 x 3.3 x 3.8 cm cyst seen within the left adnexa. Cyst is mildly complex with a few scattered low-level internal echoes. No appreciable internal vascularity or solid component. Other findings No abnormal free fluid. IMPRESSION: 1. Endometrial stripe measures 10.9 mm in thickness. In the setting of post-menopausal bleeding, endometrial sampling is indicated to exclude carcinoma. If results are benign, sonohysterogram should be considered for focal lesion work-up. (Ref: Radiological Reasoning: Algorithmic Workup of Abnormal Vaginal Bleeding with Endovaginal Sonography and Sonohysterography. AJR 2008;  [id number]). 2. 4.2 cm mildly complex left adnexal cyst, indeterminate. Gynecologic referral for further workup and surgical consultation recommended. 3. No other acute abnormality within the pelvis. Electronically Signed   By: Rise Mu M.D.   On: 02/01/2020 21:06     Assessment / Plan:   Vanessa Walker is a 59 y.o. F who presents with pelvic pain, postmenopausal bleeding  1. Long standing hx of pelvic pain, with hx of hydrosalpinx not STD related. PMB recently worsened.  Ultrasound reassuring for no clear ovarian malignancy, and she does have a hx of neg CA125, but with thickened endometrial stripe. She looks well overall, and declines pain meds, as she often does even with significant hurting. - Her H/h are stable and vital signs stable. Discharge home on Aygestin 10mg  daily for 5 days, or until bleeding controlled. Will see in office for EMBx and hysterectomy plan   Thank you for the opportunity to be involved with this pt's care.

## 2020-02-01 NOTE — ED Provider Notes (Signed)
[facility] Emergency Department Provider Note  Time seen: 8:07 PM  I have reviewed the triage vital signs and the nursing notes.   HISTORY  Chief Complaint Abdominal Pain (right side) and Vaginal Bleeding   HPI Vanessa Walker is a 59 y.o. female with a past medical history of dysmenorrhea, gastric reflux, menorrhagia, postmenopausal bleeding, presents to the emergency department for vaginal bleeding and lower abdominal pain.  According to the patient she has had intermittent vaginal bleeding over the past year or so has had biopsies performed that it felt been normal for patient.  Patient states however she has not had any recent bleeding until this past Monday when she began having heavier bleeding along with lower abdominal pain which she describes as mild to moderate dull pain across the lower abdomen.  Patient called her OB/GYN Dr. Leafy Ro who referred her to the emergency department.  Here the patient overall appears well, reassuring vitals, mild lower abdominal tenderness otherwise reassuring exam.   Past Medical History:  Diagnosis Date  . Dysmenorrhea   . GERD (gastroesophageal reflux disease)   . IBS (irritable bowel syndrome)   . Menorrhagia   . Migraine   . PMB (postmenopausal bleeding)     Patient Active Problem List   Diagnosis Date Noted  . History of dysmenorrhea 03/24/2018  . History of menorrhagia 03/24/2018  . Irritable bowel syndrome 03/24/2018  . Migraines 03/24/2018  . PMB (postmenopausal bleeding) 03/24/2018  . Stomach problems 03/24/2018  . Vitamin B 12 deficiency 05/30/2017  . Body mass index (BMI) of 32.0-32.9 in adult 01/29/2016  . Excessive weight gain 01/29/2016  . Gastroesophageal reflux disease without esophagitis 01/29/2016  . Right leg swelling 01/29/2016  . Abdominal pain 11/26/2014  . Adnexal mass 10/30/2014  . Abnormal uterine bleeding (AUB) 10/30/2014  . Abnormal pelvic ultrasound 06/28/2013    Past Surgical  History:  Procedure Laterality Date  . BREAST EXCISIONAL BIOPSY Left 2014   benign    Prior to Admission medications   Medication Sig Start Date End Date Taking? Authorizing Provider  benzonatate (TESSALON) 200 MG capsule Take one cap TID PRN cough 01/09/19   Lorin Picket, PA-C  brompheniramine-pseudoephedrine-DM 30-2-10 MG/5ML syrup Take 5 mLs by mouth 4 (four) times daily as needed. 01/09/19   Lorin Picket, PA-C  pantoprazole (PROTONIX) 40 MG tablet Take 40 mg by mouth daily.    [provider]    Allergies  Allergen Reactions  . Codeine Shortness Of Breath and Rash  . Amoxicillin Rash    vomitting    Family History  Problem Relation Age of Onset  . Ovarian cancer Maternal Aunt 70  . Breast cancer Other   . Breast cancer Other   . Fibromyalgia Mother   . Rheum arthritis Mother   . Arrhythmia Mother        PVCs  . Heart attack Father   . Diabetes Father   . Hypertension Father     Social History Social History   Tobacco Use  . Smoking status: Former Smoker    Quit date: 06/25/1983    Years since quitting: 36.6  . Smokeless tobacco: Never Used  Substance Use Topics  . Alcohol use: No  . Drug use: No    Review of Systems Constitutional: Negative for fever. Cardiovascular: Negative for chest pain. Respiratory: Negative for shortness of breath. Gastrointestinal: Mild lower abdominal pain.  Negative for vomiting or diarrhea. Genitourinary: Negative for urinary compaints.  Positive for vaginal bleeding. Musculoskeletal:  Negative for musculoskeletal complaints Neurological: Negative for headache All other ROS negative  ____________________________________________   PHYSICAL EXAM:  VITAL SIGNS: ED Triage Vitals  Enc Vitals Group     BP 02/01/20 1538 (!) 145/83     Pulse Rate 02/01/20 1538 79     Resp 02/01/20 1538 18     Temp 02/01/20 1538 98.2 F (36.8 C)     Temp Source 02/01/20 1538 Oral     SpO2 02/01/20 1538 98 %     Weight  02/01/20 1539 210 lb (95.3 kg)     Height 02/01/20 1539 5\' 10"  (1.778 m)     Head Circumference --      Peak Flow --      Pain Score 02/01/20 1539 8     Pain Loc --      Pain Edu? --      Excl. in GC? --    Constitutional: Alert and oriented. Well appearing and in no distress. Eyes: Normal exam ENT      Head: Normocephalic and atraumatic.      Mouth/Throat: Mucous membranes are moist. Cardiovascular: Normal rate, regular rhythm. Respiratory: Normal respiratory effort without tachypnea nor retractions. Breath sounds are clear  Gastrointestinal: Soft, mild lower abdominal tenderness palpation no rebound guarding or distention.  Abdomen otherwise benign. Musculoskeletal: Nontender with normal range of motion in all extremities.  Neurologic:  Normal speech and language. No gross focal neurologic deficits Skin:  Skin is warm, dry and intact.  Psychiatric: Mood and affect are normal.   ____________________________________________    RADIOLOGY   IMPRESSION:  1. Endometrial stripe measures 10.9 mm in thickness. In the setting  of post-menopausal bleeding, endometrial sampling is indicated to  exclude carcinoma. If results are benign, sonohysterogram should be  considered for focal lesion work-up. (Ref: Radiological Reasoning:  Algorithmic Workup of Abnormal Vaginal Bleeding with Endovaginal  Sonography and Sonohysterography. AJR 200806-07-2000).  2. 4.2 cm mildly complex left adnexal cyst, indeterminate.  Gynecologic referral for further workup and surgical consultation  recommended.  3. No other acute abnormality within the pelvis.   ____________________________________________   INITIAL IMPRESSION / ASSESSMENT AND PLAN / ED COURSE  Pertinent labs & imaging results that were available during my care of the patient were reviewed by me and considered in my medical decision making (see chart for details).   Patient presents to the emergency department for lower abdominal  discomfort and vaginal bleeding.  Patient's labs are reassuring.  I have ordered an ultrasound to further evaluate.  We will discussed with OB/GYN.  Patient agreeable to plan of care.  Differential would include postmenopausal bleeding, oncologic process, hormonal imbalance.  Patient does have a 10.9 mm endometrial stripe on her ultrasound.  Discussed the patient with Dr. ; [id number] who is been down to see the patient.  Recommends discharging on progesterone.  She will follow-up in the office next week.  Patient agreeable to plan of care.  JANANI CHAMBER was evaluated in Emergency Department on 02/01/2020 for the symptoms described in the history of present illness. She was evaluated in the context of the global COVID-19 pandemic, which necessitated consideration that the patient might be at risk for infection with the SARS-CoV-2 virus that causes COVID-19. Institutional protocols and algorithms that pertain to the evaluation of patients at risk for COVID-19 are in a state of rapid change based on information released by regulatory bodies including the CDC and federal and state organizations. These policies and algorithms were followed during the patient's  care in the ED.  ____________________________________________   FINAL CLINICAL IMPRESSION(S) / ED DIAGNOSES  Dysfunctional uterine bleeding   Minna Antis, MD 02/01/20 2206

## 2020-02-01 NOTE — ED Notes (Signed)
Pt resting in bed at this time. Patient is complaining of pain in the right lower quadrant rated 7/10 but denies need for pain medications. Patient states for the last year she has experienced intermittent vaginal bleeding post menopause. Pt states that today it has been bright red and recently today it is now turning more brown with clots. Pt states flow is heavy.

## 2020-02-01 NOTE — ED Triage Notes (Signed)
Pt comes POV with vaginal bleeding and RLQ pain. Pt states that her last normal period was in 2016 before this started. Pt seen by Dr. Dalbert Garnet and told to come here and let Dalbert Garnet know she was here and she would come see her. Bleeding started Tuesday night.

## 2020-02-26 ENCOUNTER — Other Ambulatory Visit: Payer: Self-pay | Admitting: Family Medicine

## 2020-02-26 DIAGNOSIS — R4701 Aphasia: Secondary | ICD-10-CM

## 2020-03-09 ENCOUNTER — Ambulatory Visit: Admission: RE | Admit: 2020-03-09 | Payer: 59 | Source: Ambulatory Visit

## 2020-04-30 ENCOUNTER — Other Ambulatory Visit: Payer: Self-pay | Admitting: Obstetrics and Gynecology

## 2020-05-17 ENCOUNTER — Inpatient Hospital Stay: Admission: RE | Admit: 2020-05-17 | Payer: 59 | Source: Ambulatory Visit

## 2020-05-23 ENCOUNTER — Other Ambulatory Visit: Payer: 59

## 2020-05-27 ENCOUNTER — Encounter: Admission: RE | Payer: Self-pay | Source: Home / Self Care

## 2020-05-27 ENCOUNTER — Ambulatory Visit: Admission: RE | Admit: 2020-05-27 | Payer: 59 | Source: Home / Self Care | Admitting: Obstetrics and Gynecology

## 2020-05-27 SURGERY — HYSTERECTOMY, TOTAL, LAPAROSCOPIC
Anesthesia: Choice

## 2020-06-11 ENCOUNTER — Other Ambulatory Visit: Payer: Self-pay

## 2020-06-11 ENCOUNTER — Ambulatory Visit
Admission: RE | Admit: 2020-06-11 | Discharge: 2020-06-11 | Disposition: A | Discharge status: Home / Self Care | Payer: 59 | Source: Ambulatory Visit | Attending: Family Medicine | Admitting: Family Medicine

## 2020-06-11 DIAGNOSIS — R4701 Aphasia: Secondary | ICD-10-CM

## 2020-08-29 ENCOUNTER — Other Ambulatory Visit: Payer: Self-pay | Admitting: Family Medicine

## 2020-08-29 DIAGNOSIS — Z1231 Encounter for screening mammogram for malignant neoplasm of breast: Secondary | ICD-10-CM

## 2020-10-03 ENCOUNTER — Ambulatory Visit
Admission: RE | Admit: 2020-10-03 | Discharge: 2020-10-03 | Disposition: A | Discharge status: Home / Self Care | Payer: 59 | Source: Ambulatory Visit | Attending: Family Medicine | Admitting: Family Medicine

## 2020-10-03 ENCOUNTER — Other Ambulatory Visit: Payer: Self-pay

## 2020-10-03 DIAGNOSIS — Z1231 Encounter for screening mammogram for malignant neoplasm of breast: Secondary | ICD-10-CM | POA: Diagnosis present

## 2020-12-17 ENCOUNTER — Other Ambulatory Visit: Payer: Self-pay

## 2021-01-14 ENCOUNTER — Other Ambulatory Visit: Payer: Self-pay

## 2021-01-14 ENCOUNTER — Ambulatory Visit (INDEPENDENT_AMBULATORY_CARE_PROVIDER_SITE_OTHER): Payer: 59

## 2021-01-14 ENCOUNTER — Ambulatory Visit (INDEPENDENT_AMBULATORY_CARE_PROVIDER_SITE_OTHER): Payer: 59 | Admitting: Podiatry

## 2021-01-14 DIAGNOSIS — R1031 Right lower quadrant pain: Secondary | ICD-10-CM | POA: Insufficient documentation

## 2021-01-14 DIAGNOSIS — R102 Pelvic and perineal pain: Secondary | ICD-10-CM | POA: Insufficient documentation

## 2021-01-14 DIAGNOSIS — S93401A Sprain of unspecified ligament of right ankle, initial encounter: Secondary | ICD-10-CM

## 2021-01-14 DIAGNOSIS — S9001XA Contusion of right ankle, initial encounter: Secondary | ICD-10-CM

## 2021-01-14 DIAGNOSIS — R600 Localized edema: Secondary | ICD-10-CM

## 2021-01-14 DIAGNOSIS — S9000XA Contusion of unspecified ankle, initial encounter: Secondary | ICD-10-CM

## 2021-01-14 NOTE — Progress Notes (Signed)
   HPI: 60 y.o. female presenting today as a new patient for evaluation of right ankle pain.  Patient states that she tripped and fell at her home on 01/11/2021.  She heard a pop in her ankle and she has been unable to apply pressure or walk without pain.  She presents for further treatment and evaluation.  Currently she has not done anything for treatment  Past Medical History:  Diagnosis Date  . Dysmenorrhea   . GERD (gastroesophageal reflux disease)   . IBS (irritable bowel syndrome)   . Menorrhagia   . Migraine   . PMB (postmenopausal bleeding)      Physical Exam: General: The patient is alert and oriented x3 in no acute distress.  Dermatology: Skin is warm, dry and supple bilateral lower extremities. Negative for open lesions or macerations.  Vascular: Palpable pedal pulses bilaterally. No erythema noted. Capillary refill within normal limits.  Edema and ecchymosis noted diffusely throughout the right ankle joint  Neurological: Epicritic and protective threshold grossly intact bilaterally.   Musculoskeletal Exam: Range of motion within normal limits to all pedal and ankle joints bilateral. Muscle strength 5/5 in all groups bilateral.  Pain with light palpation specifically to the lateral aspect of the right ankle joint overlying the ATFL.  Associated edema and ecchymosis.  Radiographic Exam:  Normal osseous mineralization. Joint spaces preserved. No fracture/dislocation/boney destruction.    Assessment: 1.  Severe ankle sprain right   Plan of Care:  1. Patient evaluated. X-Rays reviewed.  2.  Cam boot dispensed.  Weightbearing as tolerated x4 weeks 3.  Ace wrap provided and applied today.  Wear daily 4.  Recommend OTC Tylenol.  Patient declined any steroidal injection or oral anti-inflammatory medication.  Patient states that she has a very sensitive stomach 5.  Return to clinic in 4 weeks  *Husband's name is Darcel Bayley, residential Education administrator.      Felecia Shelling, DPM [facility]  Dr. Felecia Shelling, DPM    [address] [place], [place] [address]                Office [phone]  Fax [phone]

## 2021-02-11 ENCOUNTER — Other Ambulatory Visit: Payer: Self-pay

## 2021-02-11 ENCOUNTER — Ambulatory Visit (INDEPENDENT_AMBULATORY_CARE_PROVIDER_SITE_OTHER): Payer: 59 | Admitting: Podiatry

## 2021-02-11 DIAGNOSIS — S93401A Sprain of unspecified ligament of right ankle, initial encounter: Secondary | ICD-10-CM

## 2021-02-11 MED ORDER — TRAMADOL HCL 50 MG PO TABS: 50.0000 mg | ORAL_TABLET | Freq: Four times a day (QID) | ORAL | 0 refills | Status: AC | PRN

## 2021-02-24 ENCOUNTER — Other Ambulatory Visit: Payer: Self-pay

## 2021-02-24 ENCOUNTER — Ambulatory Visit
Admission: RE | Admit: 2021-02-24 | Discharge: 2021-02-24 | Disposition: A | Discharge status: Home / Self Care | Payer: 59 | Source: Ambulatory Visit | Attending: Podiatry | Admitting: Podiatry

## 2021-02-24 DIAGNOSIS — S93401A Sprain of unspecified ligament of right ankle, initial encounter: Secondary | ICD-10-CM

## 2021-02-25 NOTE — Progress Notes (Signed)
   HPI: 60 y.o. female presenting today for follow-up evaluation of a severe ankle sprain to the right ankle.  Patient states that she tripped and fell at her home on 01/11/2021.  She heard a pop in her ankle and she has been unable to apply pressure or walk without pain.    Last visit on 01/14/2021 the cam boot was dispensed to be weightbearing as tolerated x4 weeks.  She has been weightbearing in the cam boot and applying the Ace wrap daily.  Patient states that the swelling has decreased but she continues to have severe pain.  She tried to use regular shoe but was unable to do so.  She continues to take Tylenol as needed.  Overall there is not much improvement in regards to the pain  Past Medical History:  Diagnosis Date  . Dysmenorrhea   . GERD (gastroesophageal reflux disease)   . IBS (irritable bowel syndrome)   . Menorrhagia   . Migraine   . PMB (postmenopausal bleeding)      Physical Exam: General: The patient is alert and oriented x3 in no acute distress.  Dermatology: Skin is warm, dry and supple bilateral lower extremities. Negative for open lesions or macerations.  Vascular: Palpable pedal pulses bilaterally. No erythema noted. Capillary refill within normal limits.  Edema and ecchymosis noted diffusely throughout the right ankle joint  Neurological: Epicritic and protective threshold grossly intact bilaterally.   Musculoskeletal Exam: Range of motion within normal limits to all pedal and ankle joints bilateral. Muscle strength 5/5 in all groups bilateral.  Pain with light palpation specifically to the lateral aspect of the right ankle joint overlying the ATFL.  Associated edema and ecchymosis.   Assessment: 1.  Severe ankle sprain right   Plan of Care:  1. Patient evaluated. 2.  Continue weightbearing in the cam boot 3.  The patient has not improved with conservative treatment and has a history of severe ankle sprain acutely.  Today we are going to order an MRI right  ankle without contrast 4.  Prescription for tramadol 50 mg every 4 hours as needed pain #30 5.  Return to clinic after MRI to review results and discuss further treatment options  *Husband's name is Darcel Bayley, residential Education administrator.      Felecia Shelling, DPM [facility]  Dr. Felecia Shelling, DPM    [address] [place], [place] [address]                Office [phone]  Fax [phone]

## 2021-02-28 ENCOUNTER — Encounter: Payer: Self-pay | Admitting: Podiatry

## 2021-02-28 ENCOUNTER — Ambulatory Visit (INDEPENDENT_AMBULATORY_CARE_PROVIDER_SITE_OTHER): Payer: 59 | Admitting: Podiatry

## 2021-02-28 ENCOUNTER — Other Ambulatory Visit: Payer: Self-pay

## 2021-02-28 DIAGNOSIS — S93401A Sprain of unspecified ligament of right ankle, initial encounter: Secondary | ICD-10-CM

## 2021-02-28 DIAGNOSIS — R600 Localized edema: Secondary | ICD-10-CM | POA: Diagnosis not present

## 2021-02-28 MED ORDER — TRAMADOL HCL 50 MG PO TABS: 50.0000 mg | ORAL_TABLET | Freq: Four times a day (QID) | ORAL | 0 refills | Status: AC | PRN

## 2021-02-28 NOTE — Progress Notes (Signed)
   HPI: 60 y.o. female presenting today for follow-up evaluation of a severe ankle sprain to the right ankle.  Patient states that she tripped and fell at her home on 01/11/2021.  She heard a pop in her ankle and she has been unable to apply pressure or walk without pain.    MRI was ordered last visit and she presents today to discuss the MRI results and further treatment options.  Past Medical History:  Diagnosis Date  . Dysmenorrhea   . GERD (gastroesophageal reflux disease)   . IBS (irritable bowel syndrome)   . Menorrhagia   . Migraine   . PMB (postmenopausal bleeding)      Physical Exam: General: The patient is alert and oriented x3 in no acute distress.  Dermatology: Skin is warm, dry and supple bilateral lower extremities. Negative for open lesions or macerations.  Vascular: Palpable pedal pulses bilaterally. No erythema noted. Capillary refill within normal limits.  Edema and ecchymosis noted diffusely throughout the right ankle joint  Neurological: Epicritic and protective threshold grossly intact bilaterally.   Musculoskeletal Exam: Range of motion within normal limits to all pedal and ankle joints bilateral. Muscle strength 5/5 in all groups bilateral.  Pain with light palpation specifically to the lateral aspect of the right ankle joint overlying the ATFL.  Associated edema and ecchymosis.  MR ankle RT W/O contrast 02/24/2021: 1. Intrasubstance sprain with tiny interstitial tear at the insertion site of the calcaneal fibular ligament with adjacent reactive marrow on the calcaneus. 2. Mild peroneal tenosynovitis. 3. reactive marrow within the medial talar body adjacent to the subtalar joint. No osseous fracture 4. Intrasubstance sprain of the deltoid ligament 5. Insertional Achilles tendinosis 6. Middle cord plantar fasciitis with adjacent reactive marrow   Assessment: 1.  Severe ankle sprain right   Plan of Care:  1. Patient evaluated.  MRI reviewed today.   Explained to the patient that there is nothing surgically to offer at this time.  Recommend good conservative treatment modalities. 2.  Continue weightbearing in the cam boot x3 more weeks 3.  Refill prescription for tramadol 50 mg every 4 hours as needed pain 4.  Prescription for physical therapy at [facility] 5.  Patient cannot tolerate NSAIDs due to GI upset  6.  Return to clinic in 1 month  *Husband's name is Darcel Bayley, residential Education administrator. *Works at a daycare center      Felecia Shelling, DPM [facility]  Dr. Felecia Shelling, DPM    [address] [place], [place] [address]                Office [phone]  Fax [phone]

## 2021-03-28 ENCOUNTER — Ambulatory Visit: Payer: 59 | Admitting: Podiatry

## 2021-07-27 ENCOUNTER — Ambulatory Visit: Admit: 2021-07-27 | Discharge status: Absent without leave | Payer: 59

## 2021-07-28 ENCOUNTER — Ambulatory Visit
Admission: EM | Admit: 2021-07-28 | Discharge: 2021-07-28 | Disposition: A | Discharge status: Home / Self Care | Payer: 59 | Attending: Physician Assistant | Admitting: Physician Assistant

## 2021-07-28 ENCOUNTER — Other Ambulatory Visit: Payer: Self-pay

## 2021-07-28 DIAGNOSIS — S39012A Strain of muscle, fascia and tendon of lower back, initial encounter: Secondary | ICD-10-CM

## 2021-07-28 DIAGNOSIS — S161XXA Strain of muscle, fascia and tendon at neck level, initial encounter: Secondary | ICD-10-CM

## 2021-07-28 MED ORDER — TIZANIDINE HCL 4 MG PO TABS: 4.0000 mg | ORAL_TABLET | Freq: Three times a day (TID) | ORAL | 0 refills | Status: AC | PRN

## 2021-07-28 NOTE — Discharge Instructions (Addendum)
NECK PAIN: Stressed avoiding painful activities. This can exacerbate your symptoms and make them worse.  May apply heat to the areas of pain for some relief. Use medications as directed. Be aware of which medications make you drowsy and do not drive or operate any kind of heavy machinery while using the medication (ie pain medications or muscle relaxers). F/U with PCP for reexamination or return sooner if condition worsens or does not begin to improve over the next few days.  ? ?NECK PAIN RED FLAGS: If symptoms get worse than they are right now, you should come back sooner for re-evaluation. If you have increased numbness/ tingling or notice that the numbness/tingling is affecting the legs or saddle region, go to ER. If you ever lose continence go to ER.     ? ?BACK PAIN: Stressed avoiding painful activities . RICE (REST, ICE, COMPRESSION, ELEVATION) guidelines reviewed. May alternate ice and heat. Consider use of muscle rubs, Salonpas patches, etc. Use medications as directed including muscle relaxers if prescribed. Take anti-inflammatory medications as prescribed or OTC NSAIDs/Tylenol.  F/u with PCP in 7-10 days for reexamination, and please feel free to call or return to the urgent care at any time for any questions or concerns you may have and we will be happy to help you!  ? ?BACK PAIN RED FLAGS: If the back pain acutely worsens or there are any red flag symptoms such as numbness/tingling, leg weakness, saddle anesthesia, or loss of bowel/bladder control, go immediately to the ER. Follow up with us as scheduled or sooner if the pain does not begin to resolve or if it worsens before the follow up   ?
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
## 2021-07-31 ENCOUNTER — Encounter: Payer: Self-pay | Admitting: Emergency Medicine

## 2021-07-31 ENCOUNTER — Other Ambulatory Visit: Payer: Self-pay

## 2021-07-31 ENCOUNTER — Ambulatory Visit
Admission: EM | Admit: 2021-07-31 | Discharge: 2021-07-31 | Disposition: A | Discharge status: Home / Self Care | Payer: 59 | Attending: Family Medicine | Admitting: Family Medicine

## 2021-07-31 DIAGNOSIS — M545 Low back pain, unspecified: Secondary | ICD-10-CM | POA: Diagnosis not present

## 2021-07-31 MED ORDER — PREDNISONE 10 MG (21) PO TBPK: ORAL_TABLET | ORAL | 0 refills | Status: DC
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
## 2021-07-31 NOTE — Discharge Instructions (Addendum)
Rest, heat.  Medication as prescribed.  Zanaflex only at night.  Take care  Dr. Adriana Simas
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
## 2021-08-06 ENCOUNTER — Encounter: Payer: Self-pay | Admitting: Family Medicine

## 2021-08-06 ENCOUNTER — Telehealth: Payer: 59 | Admitting: Family Medicine

## 2021-08-06 DIAGNOSIS — M545 Low back pain, unspecified: Secondary | ICD-10-CM
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
## 2021-08-06 NOTE — Patient Instructions (Signed)
I appreciate the opportunity to provide you with care for your health and wellness.  Continue to heat and take medications previously ordered Please follow up with your PCP or go to the ED  Work note for today - in mychart   Please continue to practice social distancing to keep you, your family, and our community safe.  If you must go out, please wear a mask and practice good handwashing.  Have a wonderful day. With Gratitude, Tereasa Coop, DNP, AGNP-BC

## 2021-09-04 ENCOUNTER — Other Ambulatory Visit: Payer: Self-pay | Admitting: Family Medicine

## 2021-09-04 DIAGNOSIS — Z1231 Encounter for screening mammogram for malignant neoplasm of breast: Secondary | ICD-10-CM

## 2022-01-08 ENCOUNTER — Ambulatory Visit
Admission: RE | Admit: 2022-01-08 | Discharge: 2022-01-08 | Disposition: A | Discharge status: Home / Self Care | Payer: 59 | Source: Ambulatory Visit | Attending: Family Medicine | Admitting: Family Medicine

## 2022-01-08 ENCOUNTER — Other Ambulatory Visit: Payer: Self-pay

## 2022-01-08 DIAGNOSIS — Z1231 Encounter for screening mammogram for malignant neoplasm of breast: Secondary | ICD-10-CM | POA: Diagnosis not present

## 2022-03-01 ENCOUNTER — Encounter: Payer: Self-pay | Admitting: Emergency Medicine

## 2022-03-01 ENCOUNTER — Ambulatory Visit
Admission: EM | Admit: 2022-03-01 | Discharge: 2022-03-01 | Disposition: A | Discharge status: Home / Self Care | Payer: 59 | Attending: Physician Assistant | Admitting: Physician Assistant

## 2022-03-01 ENCOUNTER — Other Ambulatory Visit: Payer: Self-pay

## 2022-03-01 DIAGNOSIS — R0981 Nasal congestion: Secondary | ICD-10-CM | POA: Diagnosis not present

## 2022-03-01 DIAGNOSIS — K589 Irritable bowel syndrome without diarrhea: Secondary | ICD-10-CM | POA: Insufficient documentation

## 2022-03-01 DIAGNOSIS — J069 Acute upper respiratory infection, unspecified: Secondary | ICD-10-CM | POA: Diagnosis not present

## 2022-03-01 DIAGNOSIS — J029 Acute pharyngitis, unspecified: Secondary | ICD-10-CM | POA: Diagnosis not present

## 2022-03-01 DIAGNOSIS — Z20822 Contact with and (suspected) exposure to covid-19: Secondary | ICD-10-CM | POA: Diagnosis not present

## 2022-03-01 DIAGNOSIS — K219 Gastro-esophageal reflux disease without esophagitis: Secondary | ICD-10-CM | POA: Diagnosis not present

## 2022-03-01 LAB — GROUP A STREP BY PCR: Group A Strep by PCR: NOT DETECTED

## 2022-03-01 LAB — RESP PANEL BY RT-PCR (FLU A&B, COVID) ARPGX2
Influenza A by PCR: NEGATIVE
Influenza B by PCR: NEGATIVE
SARS Coronavirus 2 by RT PCR: NEGATIVE

## 2022-03-01 MED ORDER — LIDOCAINE VISCOUS HCL 2 % MT SOLN: 15.0000 mL | OROMUCOSAL | 0 refills | Status: DC | PRN

## 2022-03-01 NOTE — ED Triage Notes (Signed)
Patient c/o sore throat, fever and stuffy nose that started on Friday.  ?

## 2022-03-01 NOTE — ED Provider Notes (Signed)
?[facility] ? ? ? ?CSN: [id number] ?Arrival date & time: 03/01/22  1312 ? ? ?  ? ?History   ?Chief Complaint ?Chief Complaint  ?Patient presents with  ? Sore Throat  ? Fever  ? ? ?HPI ?Vanessa Walker is a 61 y.o. female.  ? ?Patient presents with fever, nasal congestion and sore throat for 3 days.  Fever has resolved.  Able to tolerate soft food and liquids.  No known sick contacts.  Has attempted use of Tylenol which has provided some relief.  History of GERD, IBS.  Denies rhinorrhea, ear pain, headaches, coughing, shortness of breath, nausea. ? ?Past Medical History:  ?Diagnosis Date  ? Dysmenorrhea   ? GERD (gastroesophageal reflux disease)   ? IBS (irritable bowel syndrome)   ? Menorrhagia   ? Migraine   ? PMB (postmenopausal bleeding)   ? ? ?Patient Active Problem List  ? Diagnosis Date Noted  ? Abdominal pain, RLQ (right lower quadrant) 01/14/2021  ? Pelvic pain in female 01/14/2021  ? Vitamin D deficiency 01/23/2019  ? Hydrosalpinx 11/09/2018  ? History of dysmenorrhea 03/24/2018  ? History of menorrhagia 03/24/2018  ? Irritable bowel syndrome 03/24/2018  ? Migraines 03/24/2018  ? PMB (postmenopausal bleeding) 03/24/2018  ? Stomach problems 03/24/2018  ? Vitamin B 12 deficiency 05/30/2017  ? Body mass index (BMI) of 32.0-32.9 in adult 01/29/2016  ? Excessive weight gain 01/29/2016  ? Gastroesophageal reflux disease without esophagitis 01/29/2016  ? Right leg swelling 01/29/2016  ? 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 Abdominal pain 11/26/2014  ? Adnexal mass 10/30/2014  ? Abnormal uterine bleeding (AUB) 10/30/2014  ? Abnormal pelvic ultrasound 06/28/2013  ? ? ?Past Surgical History:  ?Procedure Laterality Date  ? BREAST EXCISIONAL BIOPSY Left 2014  ? benign  ? ? ?OB History   ?No obstetric history on file. ?  ? ? ? ?Home Medications   ? ?Prior to Admission medications   ?Medication Sig Start Date End Date Taking? Authorizing Provider  ?pantoprazole (PROTONIX) 40 MG  tablet Take 80 mg by mouth daily.    Yes [provider]  ?acetaminophen (TYLENOL) 500 MG tablet Take 500-1,000 mg by mouth every 6 (six) hours as needed (pain.).    [provider]  ?norethindrone (AYGESTIN) 5 MG tablet Take 2 tablets (10 mg total) by mouth daily. ?Patient not taking: Reported on 05/03/2020 02/01/20   Harvest Dark, MD  ?predniSONE (STERAPRED UNI-PAK 21 TAB) 10 MG (21) TBPK tablet 6 tablets on day 1; decrease by 1 tablet daily until gone. 07/31/21   Coral Spikes, DO  ? ? ?Family History ?Family History  ?Problem Relation Age of Onset  ? Ovarian cancer Maternal Aunt 39  ? Breast cancer Other   ? Breast cancer Other   ? Fibromyalgia Mother   ? Rheum arthritis Mother   ? Arrhythmia Mother   ?     PVCs  ? Heart attack Father   ? Diabetes Father   ? Hypertension Father   ? ? ?Social History ?Social History  ? ?Tobacco Use  ? Smoking status: Former  ?  Types: Cigarettes  ?  Quit date: 06/25/1983  ?  Years since quitting: 38.7  ? Smokeless tobacco: Never  ?Vaping Use  ? Vaping Use: Never used  ?Substance Use Topics  ? Alcohol use: No  ? Drug use: No  ? ? ? ?Allergies   ?Codeine and Amoxicillin ? ? ?Review of Systems ?Review of Systems  ?Constitutional:  Positive for fever.  ?HENT:  Positive for congestion and sore throat. Negative for dental problem, drooling, ear discharge, ear pain, facial swelling, hearing loss, mouth sores, nosebleeds, postnasal drip, rhinorrhea, sinus pressure, sinus pain, sneezing, tinnitus, trouble swallowing and voice change.   ?Respiratory: Negative.    ?Cardiovascular: Negative.   ?Gastrointestinal: Negative.   ?Skin: Negative.   ?Neurological: Negative.   ? ? ?Physical Exam ?Triage Vital Signs ?ED Triage Vitals  ?Enc Vitals Group  ?   BP 03/01/22 1324 123/82  ?   Pulse Rate 03/01/22 1324 73  ?   Resp 03/01/22 1324 14  ?   Temp 03/01/22 1324 98 ?F (36.7 ?C)  ?   Temp Source 03/01/22 1324 Oral  ?   SpO2 03/01/22 1324 98 %  ?   Weight 03/01/22 1322 220 lb 0.3 oz  (99.8 kg)  ?   Height 03/01/22 1322 5\' 11"  (1.803 m)  ?   Head Circumference --   ?   Peak Flow --   ?   Pain Score 03/01/22 1322 3  ?   Pain Loc --   ?   Pain Edu? --   ?   Excl. in [place]? --   ? ?No data found. ? ?Updated Vital Signs ?BP 123/82 (BP Location: Right Arm)   Pulse 73   Temp 98 ?F (36.7 ?C) (Oral)   Resp 14   Ht 5\' 11"  (1.803 m)   Wt 220 lb 0.3 oz (99.8 kg)   SpO2 98%   BMI 30.69 kg/m?  ? ?Visual Acuity ?Right Eye Distance:   ?Left Eye Distance:   ?Bilateral Distance:   ? ?Right Eye Near:   ?Left Eye Near:    ?Bilateral Near:    ? ?Physical Exam ?Constitutional:   ?   Appearance: She is well-developed.  ?HENT:  ?   Head: Normocephalic.  ?   Right Ear: Tympanic membrane and ear canal normal.  ?   Left Ear: Tympanic membrane and ear canal normal.  ?   Nose: Congestion present. No rhinorrhea.  ?   Mouth/Throat:  ?   Mouth: Mucous membranes are moist.  ?   Pharynx: Posterior oropharyngeal erythema present.  ?   Tonsils: No tonsillar exudate. 2+ on the right. 2+ on the left.  ?Cardiovascular:  ?   Rate and Rhythm: Normal rate and regular rhythm.  ?   Heart sounds: Normal heart sounds.  ?Pulmonary:  ?   Effort: Pulmonary effort is normal.  ?   Breath sounds: Normal breath sounds.  ?Musculoskeletal:  ?   Cervical back: Normal range of motion.  ?Lymphadenopathy:  ?   Cervical: Cervical adenopathy present.  ?Skin: ?   General: Skin is warm and dry.  ?Neurological:  ?   General: No focal deficit present.  ?   Mental Status: She is alert and oriented to person, place, and time.  ?Psychiatric:     ?   Mood and Affect: Mood normal.     ?   Behavior: Behavior normal.  ? ? ? ?UC Treatments / Results  ?Labs ?(all labs ordered are listed, but only abnormal results are displayed) ?Labs Reviewed  ?GROUP A STREP BY PCR  ?RESP PANEL BY RT-PCR (FLU A&B, COVID) [id number]  ? ? ?EKG ? ? ?Radiology ?No results found. ? ?Procedures ?Procedures (including critical care time) ? ?Medications Ordered in UC ?Medications - No data to  display ? ?Initial Impression / Assessment and Plan / UC Course  ?I have reviewed  the triage vital signs and the nursing notes. ? ?Pertinent labs & imaging results that were available during my care of the patient were reviewed by me and considered in my medical decision making (see chart for details). ? ?Viral URI ? ?Vital signs are stable and well patient is stable.  She is in no signs of distress, stable for outpatient treatment, strep PCR negative, COVID and flu negative, discussed findings with patient and family, lidocaine viscous prescribed as sore throat and congestion are most worrisome symptoms, discussed use of Flonase, Mucinex and an antihistamine for management of sinus pressure and congestion, given precautions that if symptoms have not improved after 1 week that she may return urgent care for reevaluation ?Final Clinical Impressions(s) / UC Diagnoses  ? ?Final diagnoses:  ?None  ? ?Discharge Instructions   ?None ?  ? ?ED Prescriptions   ?None ?  ? ?PDMP not reviewed this encounter. ?  ?Hans Eden, NP ?03/01/22 1419 ? ?

## 2022-03-01 NOTE — Discharge Instructions (Addendum)
Your symptoms today are most likely being caused by a virus and should steadily improve in time it can take up to 7 to 10 days before you truly start to see a turnaround however things will get better, if you have not seen any improvement in your symptoms by the end of the week, please follow-up with urgent care for reevaluation ? ?For your sore throat you may gargle and spit lidocaine solution every 4 hours to give a temporary numbing effect ? ?For your sinuses ?Begin use of Mucinex, this medication will thin secretions and allow it to drain ?Begin use of Flonase twice daily, this medication will help to clear sinus passages which will release the pressure in your face and nose ?Begin use of a Claritin or Zyrtec, this medication will reduce the amount of secretions produced by your body ? ?You can take Tylenol  as needed for fever reduction and pain relief. ?  ?For cough: honey 1/2 to 1 teaspoon (you can dilute the honey in water or another fluid). . You can use a humidifier for chest congestion and cough.  If you don't have a humidifier, you can sit in the bathroom with the hot shower running.    ?  ?For sore throat: try warm salt water gargles, cepacol lozenges, throat spray, warm tea or water with lemon/honey, popsicles or ice, or OTC cold relief medicine for throat discomfort. ?  ?It is important to stay hydrated: drink plenty of fluids (water, gatorade/powerade/pedialyte, juices, or teas) to keep your throat moisturized and help further relieve irritation/discomfort.  ?

## 2022-04-13 ENCOUNTER — Other Ambulatory Visit: Payer: Self-pay

## 2022-04-13 ENCOUNTER — Ambulatory Visit
Admission: EM | Admit: 2022-04-13 | Discharge: 2022-04-13 | Disposition: A | Discharge status: Home / Self Care | Payer: 59 | Attending: Emergency Medicine | Admitting: Emergency Medicine

## 2022-04-13 ENCOUNTER — Ambulatory Visit
Admission: RE | Admit: 2022-04-13 | Discharge: 2022-04-13 | Disposition: A | Discharge status: Home / Self Care | Payer: 59 | Source: Ambulatory Visit | Attending: Family Medicine | Admitting: Family Medicine

## 2022-04-13 DIAGNOSIS — R350 Frequency of micturition: Secondary | ICD-10-CM

## 2022-04-13 LAB — URINALYSIS, ROUTINE W REFLEX MICROSCOPIC
Bilirubin Urine: NEGATIVE
Glucose, UA: NEGATIVE mg/dL
Hgb urine dipstick: NEGATIVE
Ketones, ur: NEGATIVE mg/dL
Leukocytes,Ua: NEGATIVE
Nitrite: NEGATIVE
Protein, ur: NEGATIVE mg/dL
Specific Gravity, Urine: 1.015 (ref 1.005–1.030)
pH: 8.5 — ABNORMAL HIGH (ref 5.0–8.0)

## 2022-04-13 NOTE — Discharge Instructions (Addendum)
Urinalysis is negative for infection ? ?You have an appointment scheduled for today with an arrival time of 1:45 PM [facility] for a pelvic ultrasound ? ?[address], [place], [place] [address] ? ?You will be notified of your test results within a 24-hour.  And told how to move forward ? ?At any point if your symptoms worsen prior to notification of your results please go to the nearest emergency department for management ? ? ? ? ?

## 2022-04-13 NOTE — ED Provider Notes (Signed)
?[facility] ? ? ? ?CSN: [id number] ?Arrival date & time: 04/13/22  1214 ? ? ?  ? ?History   ?Chief Complaint ?Chief Complaint  ?Patient presents with  ? Urinary Frequency  ?  Maybe UTI or kidney stone - Entered by patient  ? ? ?HPI ?Vanessa Walker is a 61 y.o. female.  ? ?Patient presents with lower abdominal pressure, urinary frequency, urgency, weakened stream and intermittent dysuria for 4 days.  Has not attempted treatment of symptoms.  Endorses that in 2015 she had similar symptoms which resulted in a abscess to the fallopian tube.  Denies hematuria, new rash or lesions, fever, chills, vaginal discharge, itching or odor. ? ? ? ?Past Medical History:  ?Diagnosis Date  ? Dysmenorrhea   ? GERD (gastroesophageal reflux disease)   ? IBS (irritable bowel syndrome)   ? Menorrhagia   ? Migraine   ? PMB (postmenopausal bleeding)   ? ? ?Patient Active Problem List  ? Diagnosis Date Noted  ? Abdominal pain, RLQ (right lower quadrant) 01/14/2021  ? Pelvic pain in female 01/14/2021  ? Vitamin D deficiency 01/23/2019  ? Hydrosalpinx 11/09/2018  ? History of dysmenorrhea 03/24/2018  ? History of menorrhagia 03/24/2018  ? Irritable bowel syndrome 03/24/2018  ? Migraines 03/24/2018  ? Postmenopausal bleeding 03/24/2018  ? Stomach problems 03/24/2018  ? Vitamin B 12 deficiency 05/30/2017  ? Body mass index (BMI) of 32.0-32.9 in adult 01/29/2016  ? Excessive weight gain 01/29/2016  ? Gastroesophageal reflux disease without esophagitis 01/29/2016  ? Right leg swelling 01/29/2016  ? 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 Abdominal pain 11/26/2014  ? Adnexal mass 10/30/2014  ? Abnormal uterine bleeding (AUB) 10/30/2014  ? Endometrial polyp 10/30/2014  ? Abnormal pelvic ultrasound 06/28/2013  ? ? ?Past Surgical History:  ?Procedure Laterality Date  ? BREAST EXCISIONAL BIOPSY Left 2014  ? benign  ? ? ?OB History   ?No obstetric history on file. ?  ? ? ? ?Home Medications   ? ?Prior to Admission  medications   ?Medication Sig Start Date End Date Taking? Authorizing Provider  ?DULoxetine (CYMBALTA) 30 MG capsule Take by mouth. 10/03/21 10/03/22 Yes [provider]  ?ergocalciferol (VITAMIN D2) 1.25 MG (50000 UT) capsule Take by mouth. 03/07/21  Yes [provider]  ?pantoprazole (PROTONIX) 40 MG tablet Take 80 mg by mouth daily.    Yes [provider]  ?acetaminophen (TYLENOL) 500 MG tablet Take 500-1,000 mg by mouth every 6 (six) hours as needed (pain.).    [provider]  ?celecoxib (CELEBREX) 100 MG capsule Take 100 mg by mouth 2 (two) times daily. 11/10/21   [provider]  ?cyanocobalamin (,VITAMIN B-12,) 1000 MCG/ML injection Inject into the muscle.    [provider]  ?doxycycline (VIBRAMYCIN) 100 MG capsule Take 100 mg by mouth 2 (two) times daily. 03/03/22   [provider]  ?gabapentin (NEURONTIN) 300 MG capsule Take by mouth. 11/10/21   [provider]  ?lidocaine (XYLOCAINE) 2 % solution Use as directed 15 mLs in the mouth or throat as needed. 03/01/22   Valinda HoarWhite, Charnice Zwilling R, NP  ?norethindrone (AYGESTIN) 5 MG tablet Take 2 tablets (10 mg total) by mouth daily. ?Patient not taking: Reported on 05/03/2020 02/01/20   Minna AntisPaduchowski, Kevin, MD  ?ondansetron (ZOFRAN) 4 MG tablet Take 4 mg by mouth every 8 (eight) hours as needed. 11/10/21   [provider]  ?predniSONE (STERAPRED UNI-PAK 21 TAB) 10 MG (21) TBPK tablet 6 tablets  on day 1; decrease by 1 tablet daily until gone. 07/31/21   Tommie Sams, DO  ? ? ?Family History ?Family History  ?Problem Relation Age of Onset  ? Ovarian cancer Maternal Aunt 3  ? Breast cancer Other   ? Breast cancer Other   ? Fibromyalgia Mother   ? Rheum arthritis Mother   ? Arrhythmia Mother   ?     PVCs  ? Heart attack Father   ? Diabetes Father   ? Hypertension Father   ? ? ?Social History ?Social History  ? ?Tobacco Use  ? Smoking status: Former  ?  Types: Cigarettes  ?  Quit date: 06/25/1983  ?  Years  since quitting: 38.8  ? Smokeless tobacco: Never  ?Vaping Use  ? Vaping Use: Never used  ?Substance Use Topics  ? Alcohol use: No  ? Drug use: No  ? ? ? ?Allergies   ?Codeine and Amoxicillin ? ? ?Review of Systems ?Review of Systems  ?Genitourinary:  Positive for dysuria, frequency and urgency. Negative for decreased urine volume, difficulty urinating, dyspareunia, enuresis, flank pain, genital sores, hematuria, menstrual problem, pelvic pain, vaginal bleeding, vaginal discharge and vaginal pain.  ? ? ?Physical Exam ?Triage Vital Signs ?ED Triage Vitals  ?Enc Vitals Group  ?   BP 04/13/22 1228 138/83  ?   Pulse Rate 04/13/22 1228 64  ?   Resp 04/13/22 1228 18  ?   Temp 04/13/22 1228 98.1 ?F (36.7 ?C)  ?   Temp Source 04/13/22 1228 Oral  ?   SpO2 04/13/22 1228 98 %  ?   Weight 04/13/22 1226 230 lb (104.3 kg)  ?   Height 04/13/22 1226 5\' 10"  (1.778 m)  ?   Head Circumference --   ?   Peak Flow --   ?   Pain Score 04/13/22 1222 8  ?   Pain Loc --   ?   Pain Edu? --   ?   Excl. in [place]? --   ? ?No data found. ? ?Updated Vital Signs ?BP 138/83 (BP Location: Left Arm)   Pulse 64   Temp 98.1 ?F (36.7 ?C) (Oral)   Resp 18   Ht 5\' 10"  (1.778 m)   Wt 230 lb (104.3 kg)   SpO2 98%   BMI 33.00 kg/m?  ? ?Visual Acuity ?Right Eye Distance:   ?Left Eye Distance:   ?Bilateral Distance:   ? ?Right Eye Near:   ?Left Eye Near:    ?Bilateral Near:    ? ?Physical Exam ?Constitutional:   ?   Appearance: Normal appearance.  ?HENT:  ?   Head: Normocephalic.  ?Eyes:  ?   Extraocular Movements: Extraocular movements intact.  ?Pulmonary:  ?   Effort: Pulmonary effort is normal.  ?Abdominal:  ?   General: Abdomen is flat. There is no distension.  ?   Palpations: Abdomen is soft.  ?   Tenderness: There is abdominal tenderness in the suprapubic area. There is no right CVA tenderness or left CVA tenderness.  ?Neurological:  ?   Mental Status: She is alert and oriented to person, place, and time. Mental status is at baseline.  ?Psychiatric:      ?   Mood and Affect: Mood normal.     ?   Behavior: Behavior normal.  ? ? ? ?UC Treatments / Results  ?Labs ?(all labs ordered are listed, but only abnormal results are displayed) ?Labs Reviewed  ?URINALYSIS, ROUTINE W REFLEX MICROSCOPIC  ? ? ?  EKG ? ? ?Radiology ?No results found. ? ?Procedures ?Procedures (including critical care time) ? ?Medications Ordered in UC ?Medications - No data to display ? ?Initial Impression / Assessment and Plan / UC Course  ?I have reviewed the triage vital signs and the nursing notes. ? ?Pertinent labs & imaging results that were available during my care of the patient were reviewed by me and considered in my medical decision making (see chart for details). ? ?Urinary frequency ? ?Vital signs are stable, patient is in no signs of distress, suprapubic tenderness noted to the abdominal exam, urinalysis negative, discussed findings with patient, declined STD testing,  able to schedule outpatient imaging for pelvic ultrasound at 145 today, labs pending, given strict precautions that if symptoms worsen prior to notification of test result she will need to go to the emergency department for further management ?Final Clinical Impressions(s) / UC Diagnoses  ? ?Final diagnoses:  ?Urinary frequency  ? ?Discharge Instructions   ?None ?  ? ?ED Prescriptions   ?None ?  ? ?PDMP not reviewed this encounter. ?  ?Valinda Hoar, NP ?04/13/22 1317 ? ?

## 2022-04-13 NOTE — ED Triage Notes (Signed)
Patient is here for "Urinary urgency". "Also worried because in 2015, had the same symptoms and had abscess on fallopian tube". Started Friday, "pain sometimes" with urination. Decreased with urgency. No fever. No vaginal discharge. No concern for STI.  ?

## 2022-04-14 ENCOUNTER — Telehealth (HOSPITAL_COMMUNITY): Payer: Self-pay

## 2022-04-14 NOTE — Telephone Encounter (Signed)
Called patient to inform her of test results and recommendation. Pt verbalized understanding and will follow up with her GYN for further evaluation.  ?

## 2022-04-16 DIAGNOSIS — R102 Pelvic and perineal pain: Secondary | ICD-10-CM | POA: Diagnosis not present

## 2022-04-16 DIAGNOSIS — Z0189 Encounter for other specified special examinations: Secondary | ICD-10-CM | POA: Diagnosis not present

## 2022-04-16 DIAGNOSIS — R3989 Other symptoms and signs involving the genitourinary system: Secondary | ICD-10-CM | POA: Diagnosis not present

## 2022-04-16 DIAGNOSIS — R350 Frequency of micturition: Secondary | ICD-10-CM | POA: Diagnosis not present

## 2022-05-25 DIAGNOSIS — E669 Obesity, unspecified: Secondary | ICD-10-CM | POA: Diagnosis not present

## 2022-05-25 DIAGNOSIS — Z6833 Body mass index (BMI) 33.0-33.9, adult: Secondary | ICD-10-CM | POA: Diagnosis not present

## 2022-05-25 DIAGNOSIS — R0602 Shortness of breath: Secondary | ICD-10-CM | POA: Diagnosis not present

## 2022-05-25 DIAGNOSIS — E538 Deficiency of other specified B group vitamins: Secondary | ICD-10-CM | POA: Diagnosis not present

## 2022-05-25 DIAGNOSIS — M1611 Unilateral primary osteoarthritis, right hip: Secondary | ICD-10-CM | POA: Diagnosis not present

## 2022-05-25 DIAGNOSIS — R5383 Other fatigue: Secondary | ICD-10-CM | POA: Diagnosis not present

## 2022-05-28 DIAGNOSIS — E538 Deficiency of other specified B group vitamins: Secondary | ICD-10-CM | POA: Diagnosis not present

## 2022-06-03 DIAGNOSIS — M25562 Pain in left knee: Secondary | ICD-10-CM | POA: Diagnosis not present

## 2022-06-03 DIAGNOSIS — S93401A Sprain of unspecified ligament of right ankle, initial encounter: Secondary | ICD-10-CM | POA: Insufficient documentation

## 2022-06-03 DIAGNOSIS — M25571 Pain in right ankle and joints of right foot: Secondary | ICD-10-CM | POA: Diagnosis not present

## 2022-06-03 DIAGNOSIS — M79671 Pain in right foot: Secondary | ICD-10-CM | POA: Insufficient documentation

## 2022-06-11 DIAGNOSIS — E538 Deficiency of other specified B group vitamins: Secondary | ICD-10-CM | POA: Diagnosis not present

## 2022-06-12 DIAGNOSIS — S93401A Sprain of unspecified ligament of right ankle, initial encounter: Secondary | ICD-10-CM | POA: Diagnosis not present

## 2022-06-12 DIAGNOSIS — M25562 Pain in left knee: Secondary | ICD-10-CM | POA: Diagnosis not present

## 2022-06-12 DIAGNOSIS — M25571 Pain in right ankle and joints of right foot: Secondary | ICD-10-CM | POA: Diagnosis not present

## 2022-06-25 DIAGNOSIS — E538 Deficiency of other specified B group vitamins: Secondary | ICD-10-CM | POA: Diagnosis not present

## 2022-08-14 DIAGNOSIS — R1310 Dysphagia, unspecified: Secondary | ICD-10-CM | POA: Diagnosis not present

## 2022-08-14 DIAGNOSIS — K219 Gastro-esophageal reflux disease without esophagitis: Secondary | ICD-10-CM | POA: Diagnosis not present

## 2022-08-14 DIAGNOSIS — E538 Deficiency of other specified B group vitamins: Secondary | ICD-10-CM | POA: Diagnosis not present

## 2022-08-18 ENCOUNTER — Other Ambulatory Visit: Payer: Self-pay | Admitting: Family Medicine

## 2022-08-18 DIAGNOSIS — R1310 Dysphagia, unspecified: Secondary | ICD-10-CM

## 2022-08-20 ENCOUNTER — Ambulatory Visit
Admission: RE | Admit: 2022-08-20 | Discharge: 2022-08-20 | Disposition: A | Discharge status: Home / Self Care | Payer: 59 | Source: Ambulatory Visit | Attending: Family Medicine | Admitting: Family Medicine

## 2022-08-20 DIAGNOSIS — R1310 Dysphagia, unspecified: Secondary | ICD-10-CM

## 2022-08-20 DIAGNOSIS — K449 Diaphragmatic hernia without obstruction or gangrene: Secondary | ICD-10-CM | POA: Diagnosis not present

## 2022-08-20 DIAGNOSIS — K219 Gastro-esophageal reflux disease without esophagitis: Secondary | ICD-10-CM | POA: Diagnosis not present

## 2022-08-20 DIAGNOSIS — K224 Dyskinesia of esophagus: Secondary | ICD-10-CM | POA: Diagnosis not present

## 2022-08-25 ENCOUNTER — Other Ambulatory Visit: Payer: Self-pay | Admitting: Family Medicine

## 2022-08-25 ENCOUNTER — Ambulatory Visit
Admission: RE | Admit: 2022-08-25 | Discharge: 2022-08-25 | Disposition: A | Discharge status: Home / Self Care | Payer: 59 | Source: Ambulatory Visit | Attending: Family Medicine | Admitting: Family Medicine

## 2022-08-25 DIAGNOSIS — R197 Diarrhea, unspecified: Secondary | ICD-10-CM | POA: Insufficient documentation

## 2022-08-25 DIAGNOSIS — N281 Cyst of kidney, acquired: Secondary | ICD-10-CM | POA: Diagnosis not present

## 2022-08-25 DIAGNOSIS — R1011 Right upper quadrant pain: Secondary | ICD-10-CM | POA: Diagnosis not present

## 2022-08-25 DIAGNOSIS — R1031 Right lower quadrant pain: Secondary | ICD-10-CM

## 2022-08-25 DIAGNOSIS — K5730 Diverticulosis of large intestine without perforation or abscess without bleeding: Secondary | ICD-10-CM | POA: Diagnosis not present

## 2022-08-25 MED ORDER — IOHEXOL 300 MG/ML  SOLN
100.0000 mL | Freq: Once | INTRAMUSCULAR | Status: AC | PRN
Start: 2022-08-25 — End: 2022-08-25
  Administered 2022-08-25: 100 mL via INTRAVENOUS

## 2022-09-02 DIAGNOSIS — E538 Deficiency of other specified B group vitamins: Secondary | ICD-10-CM | POA: Diagnosis not present

## 2022-10-14 DIAGNOSIS — N950 Postmenopausal bleeding: Secondary | ICD-10-CM | POA: Diagnosis not present

## 2022-10-14 DIAGNOSIS — N3281 Overactive bladder: Secondary | ICD-10-CM | POA: Diagnosis not present

## 2022-10-14 DIAGNOSIS — R69 Illness, unspecified: Secondary | ICD-10-CM | POA: Diagnosis not present

## 2022-10-15 DIAGNOSIS — E538 Deficiency of other specified B group vitamins: Secondary | ICD-10-CM | POA: Diagnosis not present

## 2022-10-18 ENCOUNTER — Ambulatory Visit
Admission: RE | Admit: 2022-10-18 | Discharge: 2022-10-18 | Disposition: A | Discharge status: Home / Self Care | Payer: 59 | Source: Ambulatory Visit | Attending: Family Medicine | Admitting: Family Medicine

## 2022-10-18 ENCOUNTER — Ambulatory Visit (INDEPENDENT_AMBULATORY_CARE_PROVIDER_SITE_OTHER): Payer: 59

## 2022-10-18 VITALS — BP 139/77 | HR 80 | Temp 97.9°F | Resp 16

## 2022-10-18 DIAGNOSIS — M79672 Pain in left foot: Secondary | ICD-10-CM | POA: Diagnosis not present

## 2022-10-18 DIAGNOSIS — R600 Localized edema: Secondary | ICD-10-CM | POA: Diagnosis not present

## 2022-10-18 DIAGNOSIS — M109 Gout, unspecified: Secondary | ICD-10-CM | POA: Diagnosis not present

## 2022-10-18 LAB — CBC WITH DIFFERENTIAL/PLATELET
Abs Immature Granulocytes: 0.02 10*3/uL (ref 0.00–0.07)
Basophils Absolute: 0.1 10*3/uL (ref 0.0–0.1)
Basophils Relative: 1 %
Eosinophils Absolute: 0.1 10*3/uL (ref 0.0–0.5)
Eosinophils Relative: 1 %
HCT: 38.4 % (ref 36.0–46.0)
Hemoglobin: 12.4 g/dL (ref 12.0–15.0)
Immature Granulocytes: 0 %
Lymphocytes Relative: 21 %
Lymphs Abs: 1.9 10*3/uL (ref 0.7–4.0)
MCH: 30.8 pg (ref 26.0–34.0)
MCHC: 32.3 g/dL (ref 30.0–36.0)
MCV: 95.3 fL (ref 80.0–100.0)
Monocytes Absolute: 0.7 10*3/uL (ref 0.1–1.0)
Monocytes Relative: 8 %
Neutro Abs: 6.1 10*3/uL (ref 1.7–7.7)
Neutrophils Relative %: 69 %
Platelets: 269 10*3/uL (ref 150–400)
RBC: 4.03 MIL/uL (ref 3.87–5.11)
RDW: 14.7 % (ref 11.5–15.5)
WBC: 8.9 10*3/uL (ref 4.0–10.5)
nRBC: 0 % (ref 0.0–0.2)

## 2022-10-18 LAB — URIC ACID: Uric Acid, Serum: 5.6 mg/dL (ref 2.5–7.1)

## 2022-10-18 MED ORDER — COLCHICINE 0.6 MG PO TABS: 0.6000 mg | ORAL_TABLET | Freq: Every day | ORAL | 0 refills | Status: DC

## 2022-10-18 MED ORDER — PREDNISONE 50 MG PO TABS: 50.0000 mg | ORAL_TABLET | Freq: Every day | ORAL | 0 refills | Status: AC

## 2022-10-18 NOTE — ED Provider Notes (Signed)
[facility]    CSN: [id number] Arrival date & time: 10/18/22  1408      History   Chief Complaint Chief Complaint  Patient presents with   Foot Pain    4th toe on my left foot is red swollen and the red is going up my foot. I have not hit it or anything. It is very painful. - Entered by patient    HPI  HPI Vanessa Walker is a 61 y.o. female.   Vanessa Walker presents for left foot pain that started on Thursday.  Her foot is swollen and red.  States it felt like there was a whole in her sock and her 4th toe was trapped in it but there was no hole in her sock or string tied around her toe.  Denies any injury or stubbing her foot.  Does not have history of gout or autoimmune disease to her knowledge.  Her mom does have rheumatoid arthritis.  She has not had any fevers or difficulty moving her knee or ankle.      Past Medical History:  Diagnosis Date   Dysmenorrhea    GERD (gastroesophageal reflux disease)    IBS (irritable bowel syndrome)    Menorrhagia    Migraine    PMB (postmenopausal bleeding)     Patient Active Problem List   Diagnosis Date Noted   Pain in right foot 06/03/2022   Sprain of right ankle 06/03/2022   Abdominal pain, RLQ (right lower quadrant) 01/14/2021   Pelvic pain in female 01/14/2021   Vitamin D deficiency 01/23/2019   Hydrosalpinx 11/09/2018   History of dysmenorrhea 03/24/2018   History of menorrhagia 03/24/2018   Irritable bowel syndrome 03/24/2018   Migraines 03/24/2018   Postmenopausal bleeding 03/24/2018   Stomach problems 03/24/2018   Vitamin B 12 deficiency 05/30/2017   Body mass index (BMI) of 32.0-32.9 in adult 01/29/2016   Excessive weight gain 01/29/2016   Gastroesophageal reflux disease without esophagitis 01/29/2016   Right leg swelling 01/29/2016   Symptomatic varicose veins of both lower extremities 01/29/2016   Abdominal pain 11/26/2014   Adnexal mass 10/30/2014   Abnormal uterine bleeding (AUB) 10/30/2014    Endometrial polyp 10/30/2014   Abnormal pelvic ultrasound 06/28/2013    Past Surgical History:  Procedure Laterality Date   BREAST EXCISIONAL BIOPSY Left 2014   benign    OB History   No obstetric history on file.      Home Medications    Prior to Admission medications   Medication Sig Start Date End Date Taking? Authorizing Provider  colchicine 0.6 MG tablet Take 1 tablet (0.6 mg total) by mouth daily for 7 days. On the first day take 2 tablet then 1 hour later take 1 additional tablet 10/18/22 10/25/22 Yes Tamico Mundo, DO  predniSONE (DELTASONE) 50 MG tablet Take 1 tablet (50 mg total) by mouth daily for 5 days. 10/18/22 10/23/22 Yes Dock Baccam, DO  acetaminophen (TYLENOL) 500 MG tablet Take 500-1,000 mg by mouth every 6 (six) hours as needed (pain.).    [provider]  celecoxib (CELEBREX) 100 MG capsule Take 100 mg by mouth 2 (two) times daily. 11/10/21   [provider]  cyanocobalamin (,VITAMIN B-12,) 1000 MCG/ML injection Inject into the muscle.    [provider]  doxycycline (VIBRAMYCIN) 100 MG capsule Take 100 mg by mouth 2 (two) times daily. 03/03/22   [provider]  DULoxetine (CYMBALTA) 30 MG capsule Take by mouth. 10/03/21 10/03/22  [provider]  ergocalciferol (VITAMIN D2) 1.25 MG (50000 UT) capsule Take by mouth. 03/07/21   [provider]  gabapentin (NEURONTIN) 300 MG capsule Take by mouth. 11/10/21   [provider]  lidocaine (XYLOCAINE) 2 % solution Use as directed 15 mLs in the mouth or throat as needed. 03/01/22   White, Leitha Schuller, NP  norethindrone (AYGESTIN) 5 MG tablet Take 2 tablets (10 mg total) by mouth daily. Patient not taking: Reported on 05/03/2020 02/01/20   Harvest Dark, MD  ondansetron (ZOFRAN) 4 MG tablet Take 4 mg by mouth every 8 (eight) hours as needed. 11/10/21   [provider]  pantoprazole (PROTONIX) 40 MG tablet Take 80 mg by mouth daily.     [provider]    Family History Family History  Problem Relation Age of Onset   Ovarian cancer Maternal Aunt 56   Breast cancer Other    Breast cancer Other    Fibromyalgia Mother    Rheum arthritis Mother    Arrhythmia Mother        PVCs   Heart attack Father    Diabetes Father    Hypertension Father     Social History Social History   Tobacco Use   Smoking status: Former    Types: Cigarettes    Quit date: 06/25/1983    Years since quitting: 39.3   Smokeless tobacco: Never  Vaping Use   Vaping Use: Never used  Substance Use Topics   Alcohol use: No   Drug use: No     Allergies   Codeine and Amoxicillin   Review of Systems Review of Systems: :negative unless otherwise stated in HPI.      Physical Exam Triage Vital Signs ED Triage Vitals  Enc Vitals Group     BP 10/18/22 1426 139/77     Pulse Rate 10/18/22 1426 80     Resp 10/18/22 1426 16     Temp 10/18/22 1426 97.9 F (36.6 C)     Temp Source 10/18/22 1426 Oral     SpO2 10/18/22 1426 95 %     Weight --      Height --      Head Circumference --      Peak Flow --      Pain Score 10/18/22 1424 5     Pain Loc --      Pain Edu? --      Excl. in [place]? --    No data found.  Updated Vital Signs BP 139/77 (BP Location: Left Arm)   Pulse 80   Temp 97.9 F (36.6 C) (Oral)   Resp 16   SpO2 95%   Visual Acuity Right Eye Distance:   Left Eye Distance:   Bilateral Distance:    Right Eye Near:   Left Eye Near:    Bilateral Near:     Physical Exam GEN: well appearing female in no acute distress  CVS: well perfused  RESP: speaking in full sentences without pause, no respiratory distress  MSK: Left foot- TTP noted at the 4th toe with visible erythema and questionable streaking and swelling, no bony deformity. Transverse arch grossly intact; No evidence of tibiotalar deviation; Range of motion is full in all directions. Strength is 5/5 in all directions. No tenderness at the  insertion/body/myotendinous junction of the Achilles tendon; No tenderness on posterior aspects of lateral and medial malleolus; Talar dome non-tender; Unremarkable calcaneal squeeze; No pain at base of 5th MT  UC Treatments / Results  Labs (  all labs ordered are listed, but only abnormal results are displayed) Labs Reviewed  URIC ACID  CBC WITH DIFFERENTIAL/PLATELET    EKG   Radiology No results found.  Procedures Procedures (including critical care time)  Medications Ordered in UC Medications - No data to display  Initial Impression / Assessment and Plan / UC Course  I have reviewed the triage vital signs and the nursing notes.  Pertinent labs & imaging results that were available during my care of the patient were reviewed by me and considered in my medical decision making (see chart for details).       Pt is a 61 y.o.  female with 4 days of left foot pain.  Differential diagnosis includes but not limited to foreign body, cellulitis, metatarsal or phalangeal fracture, dislocation, metatarsalgia, rheumatologic or gouty arthritis.  On exam, she has some erythema that looks like it is streaking upward towards her mid foot from the fourth toe.  Obtained left foot films.   Personally reviewed by me were unremarkable for fracture or dislocation.   Radiologist notes periarticular soft tissue calcifications at the medial fourth toe distal IP joint that is possibly related to gout.  Patient updated on x-ray findings that are consistent with gout.  Her uric acid level was normal.  She does not have any leukocytosis.  Treat acute gout with colchicine and prednisone.  Patient to gradually return to normal activities, as tolerated and continue ordinary activities within the limits permitted by pain. Return and ED precautions given. Discussed MDM, treatment plan and plan for follow-up with patient/parent who agrees with plan.   Final Clinical Impressions(s) / UC Diagnoses   Final diagnoses:   Acute gout involving toe of left foot, unspecified cause     Discharge Instructions      You have evidence of gout on your xray.  We did blood work to see if the levels are elevated in your blood.   I sent a medication called colchicine to your pharmacy.  On the first day take 2 tablet then 1 hour later take 1 additional tablet. Then take 1 tablet daily.  Also take 1 tablet of prednisone daily.       ED Prescriptions     Medication Sig Dispense Auth. Provider   colchicine 0.6 MG tablet Take 1 tablet (0.6 mg total) by mouth daily for 7 days. On the first day take 2 tablet then 1 hour later take 1 additional tablet 7 tablet Kaleiyah Polsky, DO   predniSONE (DELTASONE) 50 MG tablet Take 1 tablet (50 mg total) by mouth daily for 5 days. 5 tablet Lyndee Hensen, DO      PDMP not reviewed this encounter.   Lyndee Hensen, DO 10/21/22 0141

## 2022-10-18 NOTE — ED Triage Notes (Signed)
Patient presents to [facility] for left 4th toe pain and redness x 4 days. States pain worse on Friday. Taking tylenol.   Denies injury.

## 2022-10-18 NOTE — Discharge Instructions (Addendum)
You have evidence of gout on your xray.  We did blood work to see if the levels are elevated in your blood.   I sent a medication called colchicine to your pharmacy.  On the first day take 2 tablet then 1 hour later take 1 additional tablet. Then take 1 tablet daily.  Also take 1 tablet of prednisone daily.

## 2022-10-26 DIAGNOSIS — R69 Illness, unspecified: Secondary | ICD-10-CM | POA: Diagnosis not present

## 2022-10-26 DIAGNOSIS — F331 Major depressive disorder, recurrent, moderate: Secondary | ICD-10-CM | POA: Diagnosis not present

## 2022-10-26 DIAGNOSIS — F4322 Adjustment disorder with anxiety: Secondary | ICD-10-CM | POA: Diagnosis not present

## 2022-11-16 DIAGNOSIS — E538 Deficiency of other specified B group vitamins: Secondary | ICD-10-CM | POA: Diagnosis not present

## 2022-12-07 DIAGNOSIS — N950 Postmenopausal bleeding: Secondary | ICD-10-CM | POA: Diagnosis not present

## 2022-12-08 DIAGNOSIS — N950 Postmenopausal bleeding: Secondary | ICD-10-CM | POA: Diagnosis not present

## 2022-12-08 DIAGNOSIS — R9389 Abnormal findings on diagnostic imaging of other specified body structures: Secondary | ICD-10-CM | POA: Diagnosis not present

## 2022-12-18 DIAGNOSIS — E538 Deficiency of other specified B group vitamins: Secondary | ICD-10-CM | POA: Diagnosis not present

## 2022-12-21 ENCOUNTER — Other Ambulatory Visit: Payer: Self-pay | Admitting: Family Medicine

## 2022-12-21 DIAGNOSIS — Z1231 Encounter for screening mammogram for malignant neoplasm of breast: Secondary | ICD-10-CM

## 2022-12-22 DIAGNOSIS — F4322 Adjustment disorder with anxiety: Secondary | ICD-10-CM | POA: Diagnosis not present

## 2022-12-22 DIAGNOSIS — F331 Major depressive disorder, recurrent, moderate: Secondary | ICD-10-CM | POA: Diagnosis not present

## 2022-12-22 DIAGNOSIS — R69 Illness, unspecified: Secondary | ICD-10-CM | POA: Diagnosis not present

## 2022-12-31 DIAGNOSIS — F331 Major depressive disorder, recurrent, moderate: Secondary | ICD-10-CM | POA: Diagnosis not present

## 2022-12-31 DIAGNOSIS — R69 Illness, unspecified: Secondary | ICD-10-CM | POA: Diagnosis not present

## 2022-12-31 DIAGNOSIS — F4322 Adjustment disorder with anxiety: Secondary | ICD-10-CM | POA: Diagnosis not present

## 2023-01-01 DIAGNOSIS — E538 Deficiency of other specified B group vitamins: Secondary | ICD-10-CM | POA: Diagnosis not present

## 2023-01-11 ENCOUNTER — Ambulatory Visit
Admission: RE | Admit: 2023-01-11 | Discharge: 2023-01-11 | Disposition: A | Discharge status: Home / Self Care | Payer: 59 | Source: Ambulatory Visit | Attending: Family Medicine | Admitting: Family Medicine

## 2023-01-11 DIAGNOSIS — Z1231 Encounter for screening mammogram for malignant neoplasm of breast: Secondary | ICD-10-CM | POA: Insufficient documentation

## 2023-01-15 DIAGNOSIS — E538 Deficiency of other specified B group vitamins: Secondary | ICD-10-CM | POA: Diagnosis not present

## 2023-01-18 DIAGNOSIS — R69 Illness, unspecified: Secondary | ICD-10-CM | POA: Diagnosis not present

## 2023-01-18 DIAGNOSIS — F331 Major depressive disorder, recurrent, moderate: Secondary | ICD-10-CM | POA: Diagnosis not present

## 2023-01-18 DIAGNOSIS — F4322 Adjustment disorder with anxiety: Secondary | ICD-10-CM | POA: Diagnosis not present

## 2023-01-21 DIAGNOSIS — R9389 Abnormal findings on diagnostic imaging of other specified body structures: Secondary | ICD-10-CM | POA: Diagnosis not present

## 2023-01-21 DIAGNOSIS — N859 Noninflammatory disorder of uterus, unspecified: Secondary | ICD-10-CM | POA: Diagnosis not present

## 2023-01-21 DIAGNOSIS — N950 Postmenopausal bleeding: Secondary | ICD-10-CM | POA: Diagnosis not present

## 2023-01-21 DIAGNOSIS — Z124 Encounter for screening for malignant neoplasm of cervix: Secondary | ICD-10-CM | POA: Diagnosis not present

## 2023-01-28 DIAGNOSIS — F331 Major depressive disorder, recurrent, moderate: Secondary | ICD-10-CM | POA: Diagnosis not present

## 2023-01-28 DIAGNOSIS — F4381 Prolonged grief disorder: Secondary | ICD-10-CM | POA: Diagnosis not present

## 2023-01-28 DIAGNOSIS — F4322 Adjustment disorder with anxiety: Secondary | ICD-10-CM | POA: Diagnosis not present

## 2023-01-29 DIAGNOSIS — E538 Deficiency of other specified B group vitamins: Secondary | ICD-10-CM | POA: Diagnosis not present

## 2023-02-11 DIAGNOSIS — E538 Deficiency of other specified B group vitamins: Secondary | ICD-10-CM | POA: Diagnosis not present

## 2023-02-11 DIAGNOSIS — F4381 Prolonged grief disorder: Secondary | ICD-10-CM | POA: Diagnosis not present

## 2023-02-11 DIAGNOSIS — F4322 Adjustment disorder with anxiety: Secondary | ICD-10-CM | POA: Diagnosis not present

## 2023-02-11 DIAGNOSIS — F331 Major depressive disorder, recurrent, moderate: Secondary | ICD-10-CM | POA: Diagnosis not present

## 2023-02-15 DIAGNOSIS — F4322 Adjustment disorder with anxiety: Secondary | ICD-10-CM | POA: Diagnosis not present

## 2023-02-15 DIAGNOSIS — F331 Major depressive disorder, recurrent, moderate: Secondary | ICD-10-CM | POA: Diagnosis not present

## 2023-02-15 DIAGNOSIS — F4381 Prolonged grief disorder: Secondary | ICD-10-CM | POA: Diagnosis not present

## 2023-02-22 DIAGNOSIS — F331 Major depressive disorder, recurrent, moderate: Secondary | ICD-10-CM | POA: Diagnosis not present

## 2023-02-22 DIAGNOSIS — F4322 Adjustment disorder with anxiety: Secondary | ICD-10-CM | POA: Diagnosis not present

## 2023-02-22 DIAGNOSIS — F4381 Prolonged grief disorder: Secondary | ICD-10-CM | POA: Diagnosis not present

## 2023-02-25 DIAGNOSIS — E538 Deficiency of other specified B group vitamins: Secondary | ICD-10-CM | POA: Diagnosis not present

## 2023-03-04 DIAGNOSIS — F4381 Prolonged grief disorder: Secondary | ICD-10-CM | POA: Diagnosis not present

## 2023-03-04 DIAGNOSIS — F331 Major depressive disorder, recurrent, moderate: Secondary | ICD-10-CM | POA: Diagnosis not present

## 2023-03-04 DIAGNOSIS — F4322 Adjustment disorder with anxiety: Secondary | ICD-10-CM | POA: Diagnosis not present

## 2023-03-15 DIAGNOSIS — F331 Major depressive disorder, recurrent, moderate: Secondary | ICD-10-CM | POA: Diagnosis not present

## 2023-03-15 DIAGNOSIS — F4381 Prolonged grief disorder: Secondary | ICD-10-CM | POA: Diagnosis not present

## 2023-03-15 DIAGNOSIS — F4322 Adjustment disorder with anxiety: Secondary | ICD-10-CM | POA: Diagnosis not present

## 2023-03-16 DIAGNOSIS — E538 Deficiency of other specified B group vitamins: Secondary | ICD-10-CM | POA: Diagnosis not present

## 2023-03-19 DIAGNOSIS — F411 Generalized anxiety disorder: Secondary | ICD-10-CM | POA: Diagnosis not present

## 2023-03-19 DIAGNOSIS — R7309 Other abnormal glucose: Secondary | ICD-10-CM | POA: Diagnosis not present

## 2023-03-19 DIAGNOSIS — E559 Vitamin D deficiency, unspecified: Secondary | ICD-10-CM | POA: Diagnosis not present

## 2023-03-19 DIAGNOSIS — R5382 Chronic fatigue, unspecified: Secondary | ICD-10-CM | POA: Diagnosis not present

## 2023-03-19 DIAGNOSIS — E538 Deficiency of other specified B group vitamins: Secondary | ICD-10-CM | POA: Diagnosis not present

## 2023-03-29 DIAGNOSIS — F331 Major depressive disorder, recurrent, moderate: Secondary | ICD-10-CM | POA: Diagnosis not present

## 2023-03-29 DIAGNOSIS — F4322 Adjustment disorder with anxiety: Secondary | ICD-10-CM | POA: Diagnosis not present

## 2023-03-29 DIAGNOSIS — F4381 Prolonged grief disorder: Secondary | ICD-10-CM | POA: Diagnosis not present

## 2023-04-06 DIAGNOSIS — F331 Major depressive disorder, recurrent, moderate: Secondary | ICD-10-CM | POA: Diagnosis not present

## 2023-04-06 DIAGNOSIS — F4322 Adjustment disorder with anxiety: Secondary | ICD-10-CM | POA: Diagnosis not present

## 2023-04-06 DIAGNOSIS — F4381 Prolonged grief disorder: Secondary | ICD-10-CM | POA: Diagnosis not present

## 2023-04-12 DIAGNOSIS — E538 Deficiency of other specified B group vitamins: Secondary | ICD-10-CM | POA: Diagnosis not present

## 2023-04-17 IMAGING — MG MM DIGITAL SCREENING BILAT W/ TOMO AND CAD
8 series · 8 of 24 positions shown · non-contrast
Comparison: Previous exam(s).

ACR Breast Density Category a: The breast tissue is almost entirely
fatty.

CLINICAL DATA: Screening.

EXAM:
DIGITAL SCREENING BILATERAL MAMMOGRAM WITH TOMOSYNTHESIS AND CAD
TECHNIQUE: Bilateral screening digital craniocaudal and mediolateral oblique
mammograms were obtained. Bilateral screening digital breast
tomosynthesis was performed. The images were evaluated with
computer-aided detection.

[L MLO synth-2D]
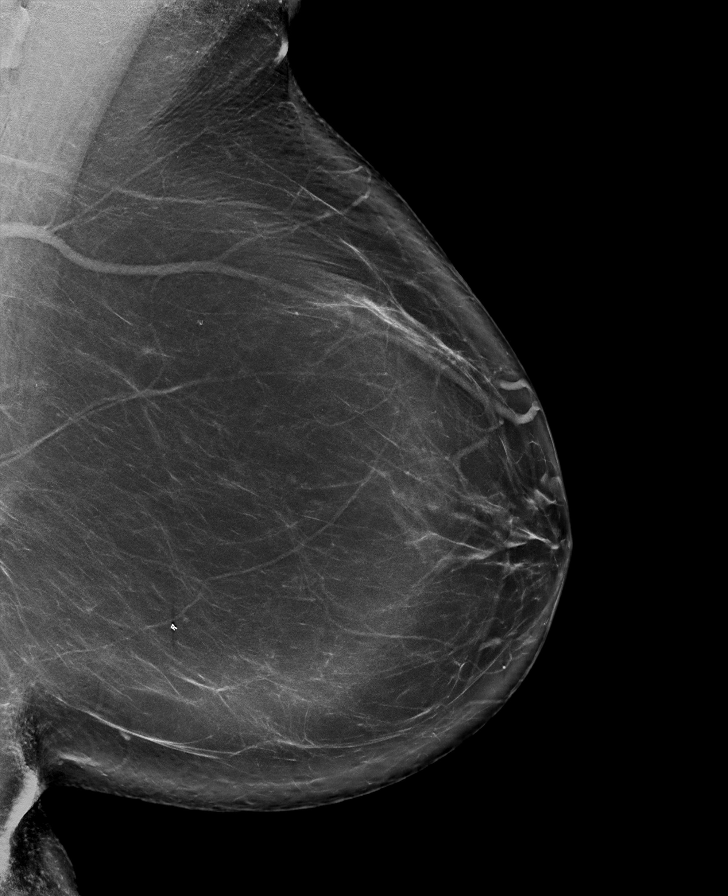

[L CC synth-2D]
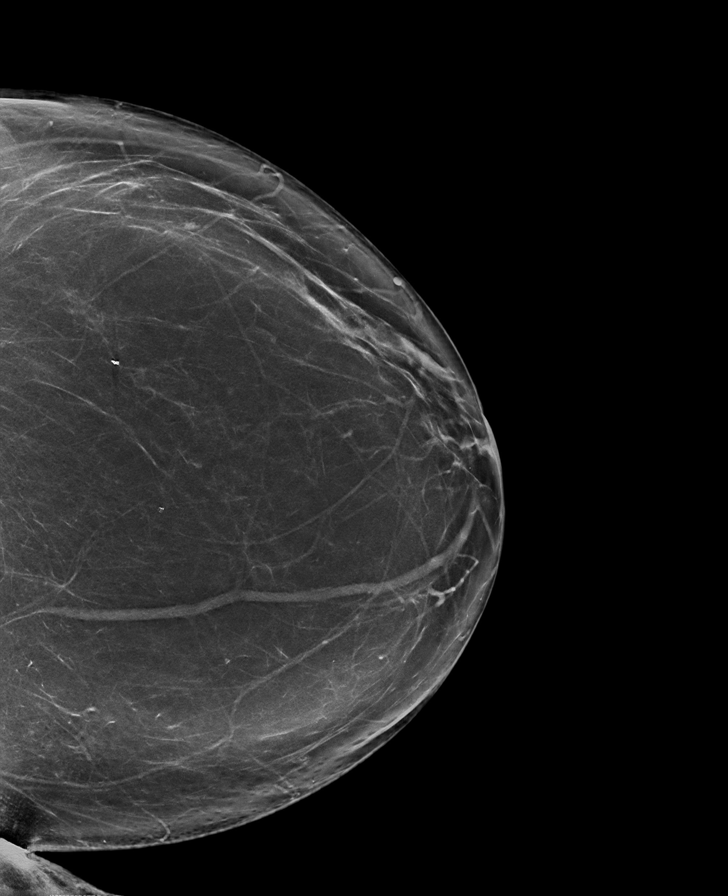

[R MLO synth-2D]
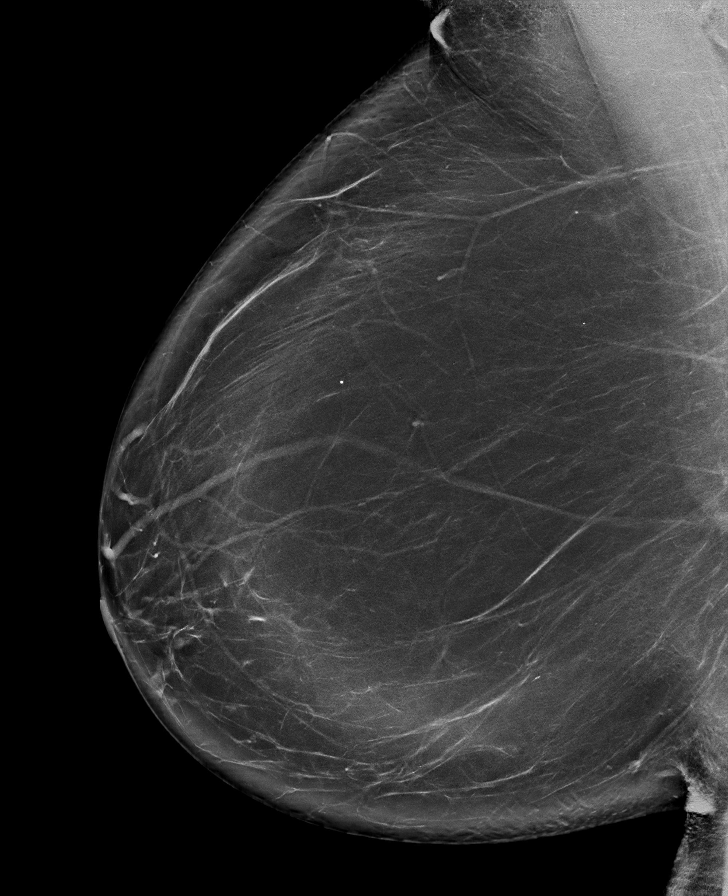

[R CC synth-2D]
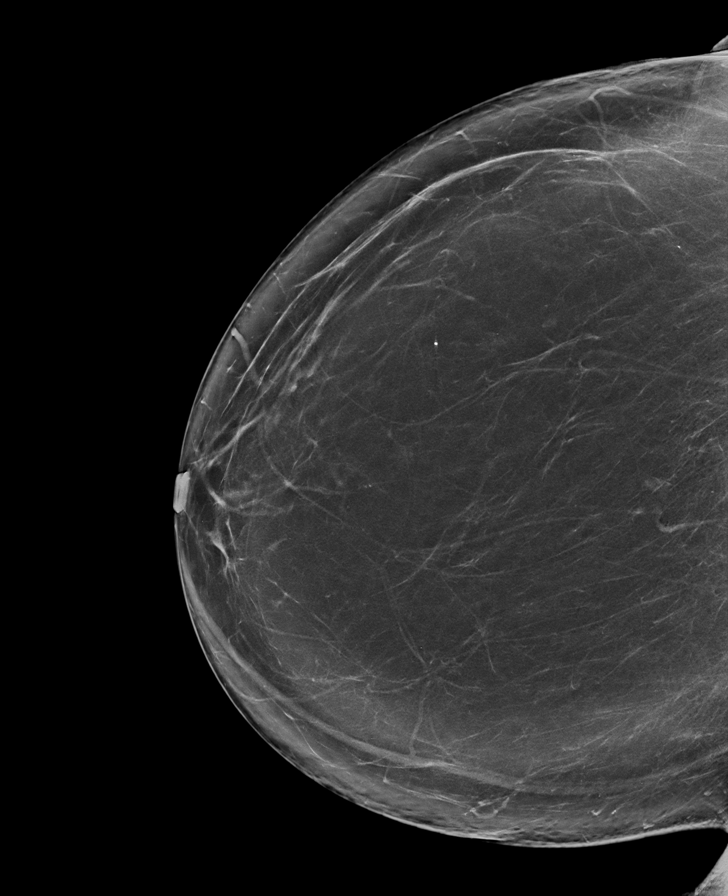

[R CC tomo · tomo slice 50/99.0]
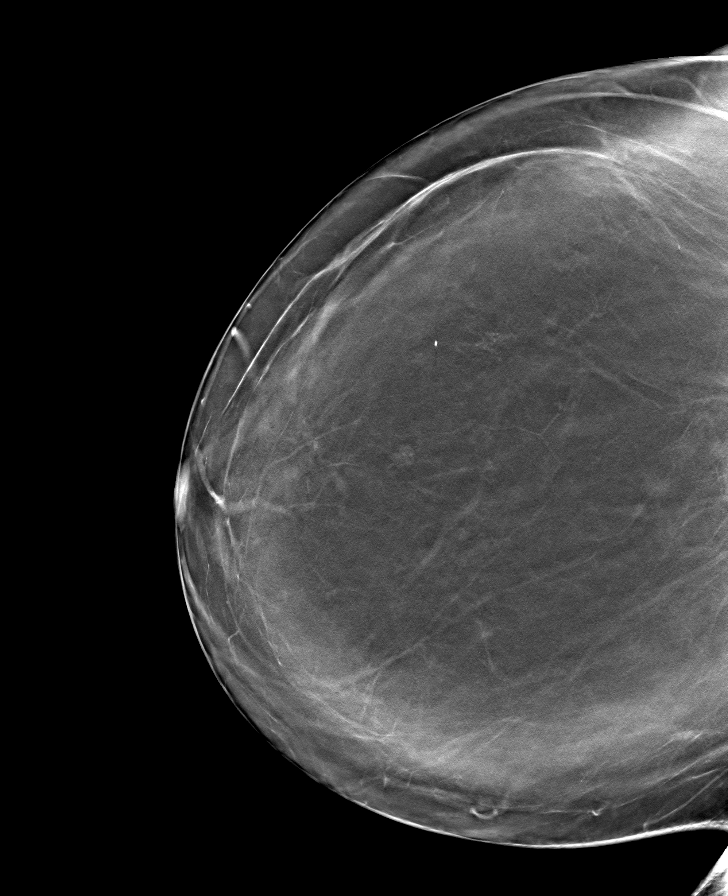

[L CC tomo · tomo slice 46/91.0]
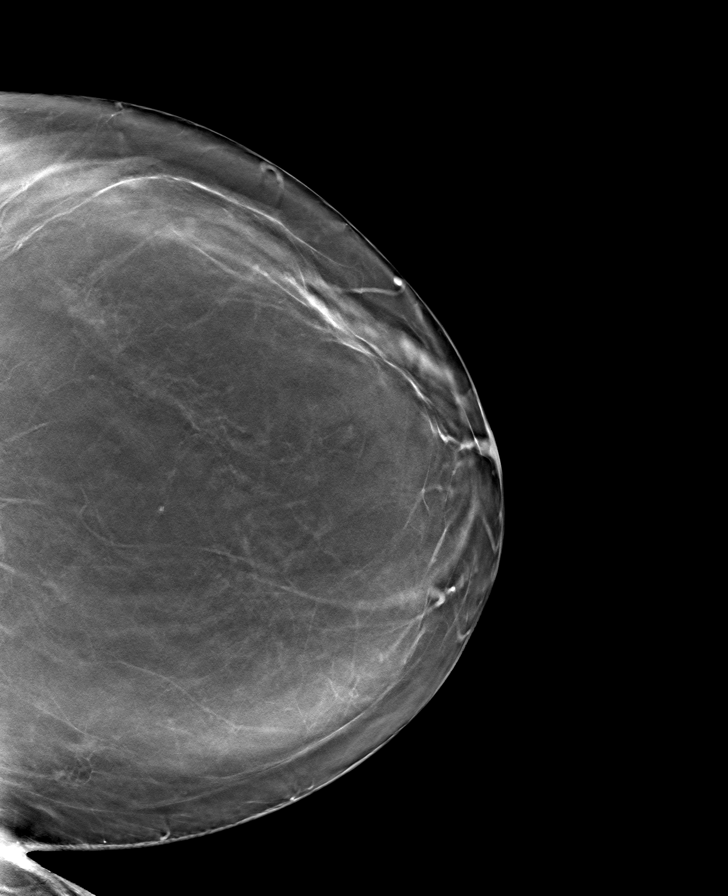

[L MLO tomo · tomo slice 49/98.0]
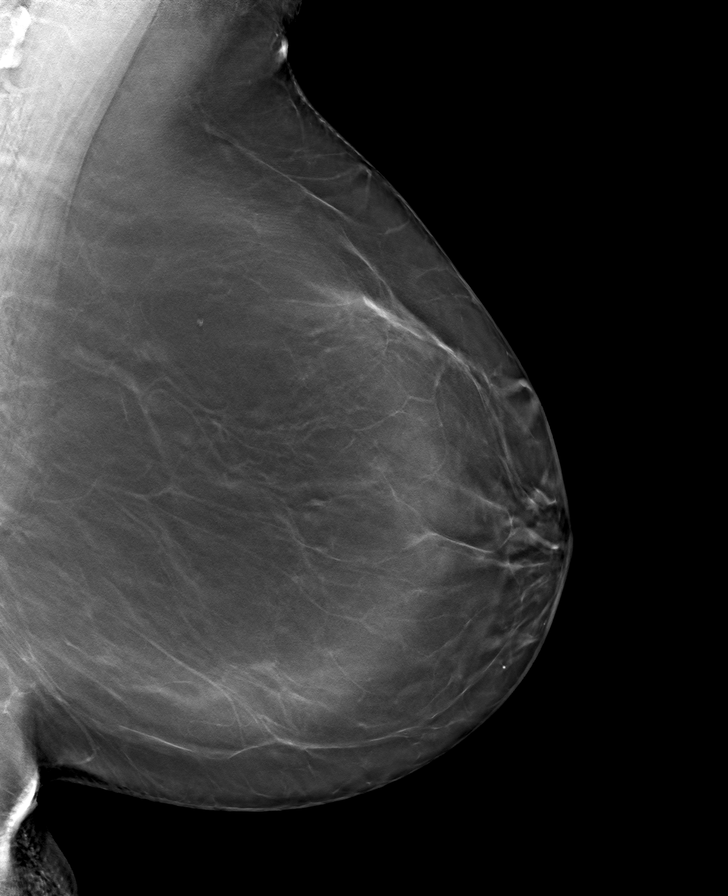

[R MLO tomo · tomo slice 51/101.0]
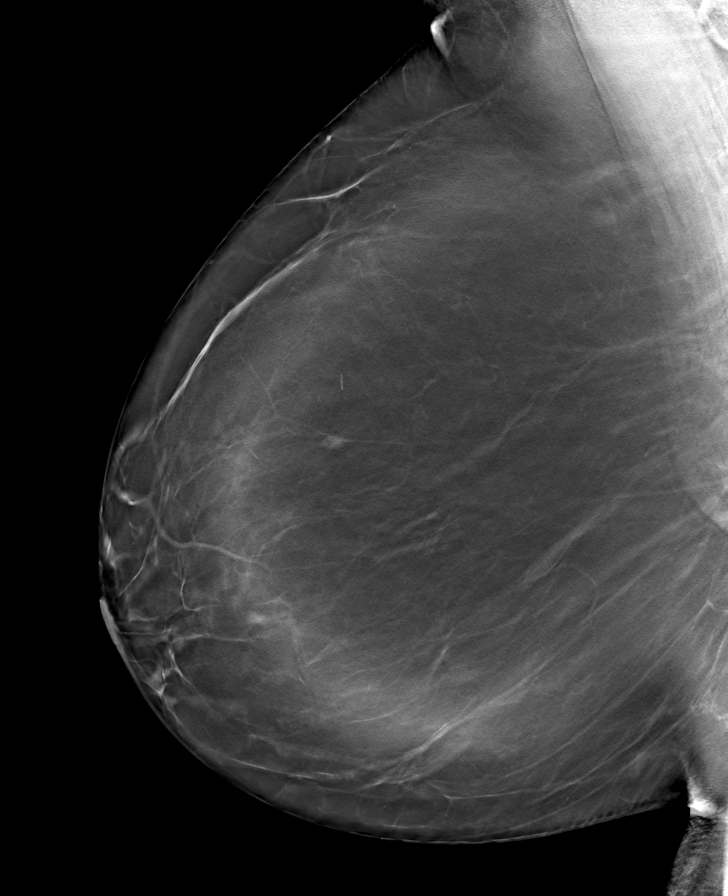

[8 of 24 positions shown; findings below may reference images not displayed]

FINDINGS: There are no findings suspicious for malignancy.
IMPRESSION: No mammographic evidence of malignancy. A result letter of this
screening mammogram will be mailed directly to the patient.

RECOMMENDATION:
Screening mammogram in one year. (Code:0E-3-N98)

BI-RADS CATEGORY  1: Negative.

## 2023-04-22 DIAGNOSIS — F4381 Prolonged grief disorder: Secondary | ICD-10-CM | POA: Diagnosis not present

## 2023-04-22 DIAGNOSIS — F331 Major depressive disorder, recurrent, moderate: Secondary | ICD-10-CM | POA: Diagnosis not present

## 2023-04-22 DIAGNOSIS — F4322 Adjustment disorder with anxiety: Secondary | ICD-10-CM | POA: Diagnosis not present

## 2023-05-04 DIAGNOSIS — F4381 Prolonged grief disorder: Secondary | ICD-10-CM | POA: Diagnosis not present

## 2023-05-04 DIAGNOSIS — F4322 Adjustment disorder with anxiety: Secondary | ICD-10-CM | POA: Diagnosis not present

## 2023-05-04 DIAGNOSIS — F331 Major depressive disorder, recurrent, moderate: Secondary | ICD-10-CM | POA: Diagnosis not present

## 2023-05-11 DIAGNOSIS — F4322 Adjustment disorder with anxiety: Secondary | ICD-10-CM | POA: Diagnosis not present

## 2023-05-11 DIAGNOSIS — F4381 Prolonged grief disorder: Secondary | ICD-10-CM | POA: Diagnosis not present

## 2023-05-11 DIAGNOSIS — F331 Major depressive disorder, recurrent, moderate: Secondary | ICD-10-CM | POA: Diagnosis not present

## 2023-05-17 DIAGNOSIS — F4381 Prolonged grief disorder: Secondary | ICD-10-CM | POA: Diagnosis not present

## 2023-05-17 DIAGNOSIS — F4322 Adjustment disorder with anxiety: Secondary | ICD-10-CM | POA: Diagnosis not present

## 2023-05-17 DIAGNOSIS — F331 Major depressive disorder, recurrent, moderate: Secondary | ICD-10-CM | POA: Diagnosis not present

## 2023-05-19 DIAGNOSIS — E538 Deficiency of other specified B group vitamins: Secondary | ICD-10-CM | POA: Diagnosis not present

## 2023-06-14 DIAGNOSIS — F4381 Prolonged grief disorder: Secondary | ICD-10-CM | POA: Diagnosis not present

## 2023-06-14 DIAGNOSIS — F4322 Adjustment disorder with anxiety: Secondary | ICD-10-CM | POA: Diagnosis not present

## 2023-06-14 DIAGNOSIS — F331 Major depressive disorder, recurrent, moderate: Secondary | ICD-10-CM | POA: Diagnosis not present

## 2023-06-21 DIAGNOSIS — E538 Deficiency of other specified B group vitamins: Secondary | ICD-10-CM | POA: Diagnosis not present

## 2023-06-22 DIAGNOSIS — F331 Major depressive disorder, recurrent, moderate: Secondary | ICD-10-CM | POA: Diagnosis not present

## 2023-06-22 DIAGNOSIS — F4381 Prolonged grief disorder: Secondary | ICD-10-CM | POA: Diagnosis not present

## 2023-06-22 DIAGNOSIS — F4322 Adjustment disorder with anxiety: Secondary | ICD-10-CM | POA: Diagnosis not present

## 2023-07-06 DIAGNOSIS — F331 Major depressive disorder, recurrent, moderate: Secondary | ICD-10-CM | POA: Diagnosis not present

## 2023-07-06 DIAGNOSIS — F4381 Prolonged grief disorder: Secondary | ICD-10-CM | POA: Diagnosis not present

## 2023-07-06 DIAGNOSIS — F4322 Adjustment disorder with anxiety: Secondary | ICD-10-CM | POA: Diagnosis not present

## 2023-07-22 DIAGNOSIS — E538 Deficiency of other specified B group vitamins: Secondary | ICD-10-CM | POA: Diagnosis not present

## 2023-08-09 DIAGNOSIS — F4381 Prolonged grief disorder: Secondary | ICD-10-CM | POA: Diagnosis not present

## 2023-08-09 DIAGNOSIS — F4322 Adjustment disorder with anxiety: Secondary | ICD-10-CM | POA: Diagnosis not present

## 2023-08-09 DIAGNOSIS — F331 Major depressive disorder, recurrent, moderate: Secondary | ICD-10-CM | POA: Diagnosis not present

## 2023-08-30 DIAGNOSIS — E538 Deficiency of other specified B group vitamins: Secondary | ICD-10-CM | POA: Diagnosis not present

## 2023-09-20 DIAGNOSIS — F4381 Prolonged grief disorder: Secondary | ICD-10-CM | POA: Diagnosis not present

## 2023-09-20 DIAGNOSIS — F4322 Adjustment disorder with anxiety: Secondary | ICD-10-CM | POA: Diagnosis not present

## 2023-09-20 DIAGNOSIS — F331 Major depressive disorder, recurrent, moderate: Secondary | ICD-10-CM | POA: Diagnosis not present

## 2023-10-01 DIAGNOSIS — E538 Deficiency of other specified B group vitamins: Secondary | ICD-10-CM | POA: Diagnosis not present

## 2023-10-04 DIAGNOSIS — M2550 Pain in unspecified joint: Secondary | ICD-10-CM | POA: Diagnosis not present

## 2023-11-01 DIAGNOSIS — E538 Deficiency of other specified B group vitamins: Secondary | ICD-10-CM | POA: Diagnosis not present

## 2023-12-09 DIAGNOSIS — E538 Deficiency of other specified B group vitamins: Secondary | ICD-10-CM | POA: Diagnosis not present

## 2023-12-23 ENCOUNTER — Other Ambulatory Visit: Payer: Self-pay | Admitting: Family Medicine

## 2023-12-23 DIAGNOSIS — Z1231 Encounter for screening mammogram for malignant neoplasm of breast: Secondary | ICD-10-CM

## 2024-01-11 DIAGNOSIS — E538 Deficiency of other specified B group vitamins: Secondary | ICD-10-CM | POA: Diagnosis not present

## 2024-01-12 DIAGNOSIS — L821 Other seborrheic keratosis: Secondary | ICD-10-CM | POA: Diagnosis not present

## 2024-01-13 ENCOUNTER — Ambulatory Visit
Admission: RE | Admit: 2024-01-13 | Discharge: 2024-01-13 | Disposition: A | Discharge status: Home / Self Care | Payer: 59 | Source: Ambulatory Visit | Attending: Family Medicine | Admitting: Family Medicine

## 2024-01-13 DIAGNOSIS — Z1231 Encounter for screening mammogram for malignant neoplasm of breast: Secondary | ICD-10-CM | POA: Insufficient documentation

## 2024-01-25 DIAGNOSIS — R102 Pelvic and perineal pain: Secondary | ICD-10-CM | POA: Diagnosis not present

## 2024-01-25 DIAGNOSIS — N939 Abnormal uterine and vaginal bleeding, unspecified: Secondary | ICD-10-CM | POA: Diagnosis not present

## 2024-01-25 DIAGNOSIS — E538 Deficiency of other specified B group vitamins: Secondary | ICD-10-CM | POA: Diagnosis not present

## 2024-01-26 ENCOUNTER — Other Ambulatory Visit: Payer: Self-pay | Admitting: Family Medicine

## 2024-01-26 DIAGNOSIS — R1024 Suprapubic pain: Secondary | ICD-10-CM

## 2024-01-26 DIAGNOSIS — N939 Abnormal uterine and vaginal bleeding, unspecified: Secondary | ICD-10-CM

## 2024-01-26 DIAGNOSIS — R102 Pelvic and perineal pain: Secondary | ICD-10-CM

## 2024-01-27 ENCOUNTER — Ambulatory Visit
Admission: RE | Admit: 2024-01-27 | Discharge: 2024-01-27 | Disposition: A | Discharge status: Home / Self Care | Payer: 59 | Source: Ambulatory Visit | Attending: Family Medicine | Admitting: Family Medicine

## 2024-01-27 DIAGNOSIS — N939 Abnormal uterine and vaginal bleeding, unspecified: Secondary | ICD-10-CM | POA: Insufficient documentation

## 2024-01-27 DIAGNOSIS — R102 Pelvic and perineal pain: Secondary | ICD-10-CM | POA: Insufficient documentation

## 2024-02-08 DIAGNOSIS — N950 Postmenopausal bleeding: Secondary | ICD-10-CM | POA: Diagnosis not present

## 2024-03-02 DIAGNOSIS — E538 Deficiency of other specified B group vitamins: Secondary | ICD-10-CM | POA: Diagnosis not present

## 2024-04-03 DIAGNOSIS — R002 Palpitations: Secondary | ICD-10-CM | POA: Diagnosis not present

## 2024-04-03 DIAGNOSIS — Z7960 Long term (current) use of unspecified immunomodulators and immunosuppressants: Secondary | ICD-10-CM | POA: Diagnosis not present

## 2024-04-03 DIAGNOSIS — L4050 Arthropathic psoriasis, unspecified: Secondary | ICD-10-CM | POA: Diagnosis not present

## 2024-04-03 DIAGNOSIS — L409 Psoriasis, unspecified: Secondary | ICD-10-CM | POA: Diagnosis not present

## 2024-04-03 DIAGNOSIS — M539 Dorsopathy, unspecified: Secondary | ICD-10-CM | POA: Diagnosis not present

## 2024-04-03 DIAGNOSIS — E66812 Obesity, class 2: Secondary | ICD-10-CM | POA: Diagnosis not present

## 2024-04-03 DIAGNOSIS — E538 Deficiency of other specified B group vitamins: Secondary | ICD-10-CM | POA: Diagnosis not present

## 2024-04-03 DIAGNOSIS — M797 Fibromyalgia: Secondary | ICD-10-CM | POA: Diagnosis not present

## 2024-04-03 DIAGNOSIS — Z6835 Body mass index (BMI) 35.0-35.9, adult: Secondary | ICD-10-CM | POA: Diagnosis not present

## 2024-04-03 DIAGNOSIS — E559 Vitamin D deficiency, unspecified: Secondary | ICD-10-CM | POA: Diagnosis not present

## 2024-04-12 DIAGNOSIS — R002 Palpitations: Secondary | ICD-10-CM | POA: Diagnosis not present

## 2024-04-20 DIAGNOSIS — M9903 Segmental and somatic dysfunction of lumbar region: Secondary | ICD-10-CM | POA: Diagnosis not present

## 2024-04-20 DIAGNOSIS — M9902 Segmental and somatic dysfunction of thoracic region: Secondary | ICD-10-CM | POA: Diagnosis not present

## 2024-04-20 DIAGNOSIS — M531 Cervicobrachial syndrome: Secondary | ICD-10-CM | POA: Diagnosis not present

## 2024-04-20 DIAGNOSIS — M955 Acquired deformity of pelvis: Secondary | ICD-10-CM | POA: Diagnosis not present

## 2024-04-20 DIAGNOSIS — M546 Pain in thoracic spine: Secondary | ICD-10-CM | POA: Diagnosis not present

## 2024-04-20 DIAGNOSIS — M9901 Segmental and somatic dysfunction of cervical region: Secondary | ICD-10-CM | POA: Diagnosis not present

## 2024-04-20 DIAGNOSIS — M9905 Segmental and somatic dysfunction of pelvic region: Secondary | ICD-10-CM | POA: Diagnosis not present

## 2024-04-20 DIAGNOSIS — M545 Low back pain, unspecified: Secondary | ICD-10-CM | POA: Diagnosis not present

## 2024-04-21 DIAGNOSIS — R7989 Other specified abnormal findings of blood chemistry: Secondary | ICD-10-CM | POA: Diagnosis not present

## 2024-04-27 DIAGNOSIS — M25552 Pain in left hip: Secondary | ICD-10-CM | POA: Diagnosis not present

## 2024-04-27 DIAGNOSIS — M5442 Lumbago with sciatica, left side: Secondary | ICD-10-CM | POA: Diagnosis not present

## 2024-05-09 DIAGNOSIS — M25551 Pain in right hip: Secondary | ICD-10-CM | POA: Diagnosis not present

## 2024-05-09 DIAGNOSIS — M166 Other bilateral secondary osteoarthritis of hip: Secondary | ICD-10-CM | POA: Diagnosis not present

## 2024-06-07 DIAGNOSIS — E538 Deficiency of other specified B group vitamins: Secondary | ICD-10-CM | POA: Diagnosis not present

## 2024-07-12 DIAGNOSIS — E538 Deficiency of other specified B group vitamins: Secondary | ICD-10-CM | POA: Diagnosis not present

## 2024-07-25 DIAGNOSIS — J40 Bronchitis, not specified as acute or chronic: Secondary | ICD-10-CM | POA: Diagnosis not present

## 2024-08-14 DIAGNOSIS — K219 Gastro-esophageal reflux disease without esophagitis: Secondary | ICD-10-CM | POA: Diagnosis not present

## 2024-08-14 DIAGNOSIS — Z0000 Encounter for general adult medical examination without abnormal findings: Secondary | ICD-10-CM | POA: Diagnosis not present

## 2024-08-14 DIAGNOSIS — Z1331 Encounter for screening for depression: Secondary | ICD-10-CM | POA: Diagnosis not present

## 2024-08-14 DIAGNOSIS — M797 Fibromyalgia: Secondary | ICD-10-CM | POA: Diagnosis not present

## 2024-08-14 DIAGNOSIS — L409 Psoriasis, unspecified: Secondary | ICD-10-CM | POA: Diagnosis not present

## 2024-08-14 DIAGNOSIS — R002 Palpitations: Secondary | ICD-10-CM | POA: Diagnosis not present

## 2024-08-14 DIAGNOSIS — E66812 Obesity, class 2: Secondary | ICD-10-CM | POA: Diagnosis not present

## 2024-08-14 DIAGNOSIS — R490 Dysphonia: Secondary | ICD-10-CM | POA: Diagnosis not present

## 2024-08-14 DIAGNOSIS — Z1211 Encounter for screening for malignant neoplasm of colon: Secondary | ICD-10-CM | POA: Diagnosis not present

## 2024-08-14 DIAGNOSIS — E538 Deficiency of other specified B group vitamins: Secondary | ICD-10-CM | POA: Diagnosis not present

## 2024-08-14 DIAGNOSIS — R7989 Other specified abnormal findings of blood chemistry: Secondary | ICD-10-CM | POA: Diagnosis not present

## 2024-08-14 DIAGNOSIS — E559 Vitamin D deficiency, unspecified: Secondary | ICD-10-CM | POA: Diagnosis not present

## 2024-08-15 DIAGNOSIS — R809 Proteinuria, unspecified: Secondary | ICD-10-CM | POA: Diagnosis not present

## 2024-09-15 ENCOUNTER — Other Ambulatory Visit: Payer: Self-pay | Admitting: Family Medicine

## 2024-09-15 DIAGNOSIS — N939 Abnormal uterine and vaginal bleeding, unspecified: Secondary | ICD-10-CM | POA: Diagnosis not present

## 2024-09-15 DIAGNOSIS — R1032 Left lower quadrant pain: Secondary | ICD-10-CM | POA: Diagnosis not present

## 2024-09-15 DIAGNOSIS — R350 Frequency of micturition: Secondary | ICD-10-CM | POA: Diagnosis not present

## 2024-09-15 DIAGNOSIS — R630 Anorexia: Secondary | ICD-10-CM | POA: Diagnosis not present

## 2024-09-18 ENCOUNTER — Encounter: Payer: Self-pay | Admitting: Family Medicine

## 2024-09-18 ENCOUNTER — Other Ambulatory Visit

## 2024-09-19 ENCOUNTER — Inpatient Hospital Stay: Admission: RE | Admit: 2024-09-19 | Source: Ambulatory Visit

## 2024-09-26 ENCOUNTER — Ambulatory Visit
Admission: RE | Admit: 2024-09-26 | Discharge: 2024-09-26 | Disposition: A | Discharge status: Home / Self Care | Source: Ambulatory Visit | Attending: Family Medicine | Admitting: Family Medicine

## 2024-09-26 DIAGNOSIS — N200 Calculus of kidney: Secondary | ICD-10-CM | POA: Diagnosis not present

## 2024-09-26 DIAGNOSIS — N281 Cyst of kidney, acquired: Secondary | ICD-10-CM | POA: Diagnosis not present

## 2024-09-26 DIAGNOSIS — K5730 Diverticulosis of large intestine without perforation or abscess without bleeding: Secondary | ICD-10-CM | POA: Diagnosis not present

## 2024-09-26 DIAGNOSIS — R1032 Left lower quadrant pain: Secondary | ICD-10-CM

## 2024-09-26 MED ORDER — IOPAMIDOL (ISOVUE-300) INJECTION 61%
100.0000 mL | Freq: Once | INTRAVENOUS | Status: AC | PRN
  Administered 2024-09-26: 100 mL via INTRAVENOUS

## 2024-10-03 DIAGNOSIS — Z7960 Long term (current) use of unspecified immunomodulators and immunosuppressants: Secondary | ICD-10-CM | POA: Diagnosis not present

## 2024-10-03 DIAGNOSIS — M47819 Spondylosis without myelopathy or radiculopathy, site unspecified: Secondary | ICD-10-CM | POA: Diagnosis not present

## 2024-10-03 DIAGNOSIS — E559 Vitamin D deficiency, unspecified: Secondary | ICD-10-CM | POA: Diagnosis not present

## 2024-10-03 DIAGNOSIS — L4050 Arthropathic psoriasis, unspecified: Secondary | ICD-10-CM | POA: Diagnosis not present

## 2024-10-03 DIAGNOSIS — M797 Fibromyalgia: Secondary | ICD-10-CM | POA: Diagnosis not present

## 2024-10-03 DIAGNOSIS — L409 Psoriasis, unspecified: Secondary | ICD-10-CM | POA: Diagnosis not present

## 2024-10-05 DIAGNOSIS — E538 Deficiency of other specified B group vitamins: Secondary | ICD-10-CM | POA: Diagnosis not present

## 2024-10-05 DIAGNOSIS — R109 Unspecified abdominal pain: Secondary | ICD-10-CM | POA: Diagnosis not present

## 2024-10-05 DIAGNOSIS — R194 Change in bowel habit: Secondary | ICD-10-CM | POA: Diagnosis not present

## 2024-10-05 DIAGNOSIS — F4323 Adjustment disorder with mixed anxiety and depressed mood: Secondary | ICD-10-CM | POA: Diagnosis not present

## 2024-10-17 ENCOUNTER — Ambulatory Visit
Admission: RE | Admit: 2024-10-17 | Discharge: 2024-10-17 | Disposition: A | Discharge status: Home / Self Care | Source: Ambulatory Visit | Attending: Family Medicine | Admitting: Family Medicine

## 2024-10-17 ENCOUNTER — Other Ambulatory Visit: Payer: Self-pay | Admitting: Family Medicine

## 2024-10-17 DIAGNOSIS — R82998 Other abnormal findings in urine: Secondary | ICD-10-CM | POA: Diagnosis not present

## 2024-10-17 DIAGNOSIS — N3000 Acute cystitis without hematuria: Secondary | ICD-10-CM | POA: Diagnosis not present

## 2024-10-17 DIAGNOSIS — Z2821 Immunization not carried out because of patient refusal: Secondary | ICD-10-CM | POA: Diagnosis not present

## 2024-10-17 DIAGNOSIS — N939 Abnormal uterine and vaginal bleeding, unspecified: Secondary | ICD-10-CM

## 2024-10-17 DIAGNOSIS — R102 Pelvic and perineal pain unspecified side: Secondary | ICD-10-CM | POA: Diagnosis not present

## 2024-10-23 ENCOUNTER — Other Ambulatory Visit: Payer: Self-pay | Admitting: Obstetrics and Gynecology

## 2024-11-16 ENCOUNTER — Ambulatory Visit: Admission: RE | Admit: 2024-11-16 | Admitting: Gastroenterology

## 2024-11-16 ENCOUNTER — Encounter: Admission: RE | Payer: Self-pay

## 2024-11-16 SURGERY — COLONOSCOPY
Anesthesia: General

## 2024-12-22 ENCOUNTER — Inpatient Hospital Stay: Admission: RE | Admit: 2024-12-22 | Source: Ambulatory Visit

## 2024-12-29 ENCOUNTER — Ambulatory Visit: Admission: RE | Admit: 2024-12-29 | Source: Home / Self Care | Admitting: Obstetrics and Gynecology

## 2024-12-29 ENCOUNTER — Encounter: Admission: RE | Payer: Self-pay | Source: Home / Self Care

## 2024-12-29 SURGERY — HYSTERECTOMY, TOTAL, ROBOT-ASSISTED, LAPAROSCOPIC, WITH BILATERAL SALPINGO-OOPHORECTOMY
Anesthesia: Choice | Site: Bladder
